# Patient Record
Sex: Female | Born: 1945 | Race: White | Hispanic: No | Smoking: Former smoker
Health system: Southern US, Community
[De-identification: ages and names within clinical notes are randomized; demographics above are authoritative.]

## PROBLEM LIST (undated history)

## (undated) DIAGNOSIS — C801 Malignant (primary) neoplasm, unspecified: Secondary | ICD-10-CM

## (undated) DIAGNOSIS — Z972 Presence of dental prosthetic device (complete) (partial): Secondary | ICD-10-CM

## (undated) DIAGNOSIS — R519 Headache, unspecified: Secondary | ICD-10-CM

## (undated) DIAGNOSIS — J449 Chronic obstructive pulmonary disease, unspecified: Secondary | ICD-10-CM

## (undated) HISTORY — PX: ABDOMINAL HYSTERECTOMY: SHX81

---

## 2014-01-10 DIAGNOSIS — Z923 Personal history of irradiation: Secondary | ICD-10-CM

## 2014-01-10 HISTORY — DX: Personal history of irradiation: Z92.3

## 2014-05-11 DIAGNOSIS — C801 Malignant (primary) neoplasm, unspecified: Secondary | ICD-10-CM

## 2014-05-11 HISTORY — PX: MASTECTOMY, PARTIAL: SHX709

## 2014-05-11 HISTORY — DX: Malignant (primary) neoplasm, unspecified: C80.1

## 2014-05-11 HISTORY — PX: BREAST LUMPECTOMY: SHX2

## 2016-09-29 ENCOUNTER — Observation Stay (HOSPITAL_BASED_OUTPATIENT_CLINIC_OR_DEPARTMENT_OTHER)
Admit: 2016-09-29 | Discharge: 2016-09-29 | Disposition: A | Discharge status: Home / Self Care | Payer: BLUE CROSS/BLUE SHIELD | Attending: Internal Medicine | Admitting: Internal Medicine

## 2016-09-29 ENCOUNTER — Observation Stay: Payer: BLUE CROSS/BLUE SHIELD

## 2016-09-29 ENCOUNTER — Observation Stay
Admission: EM | Admit: 2016-09-29 | Discharge: 2016-09-30 | Disposition: A | Discharge status: Home / Self Care | Payer: BLUE CROSS/BLUE SHIELD | Attending: Internal Medicine | Admitting: Internal Medicine

## 2016-09-29 ENCOUNTER — Encounter: Payer: Self-pay | Admitting: Emergency Medicine

## 2016-09-29 ENCOUNTER — Emergency Department: Payer: BLUE CROSS/BLUE SHIELD

## 2016-09-29 DIAGNOSIS — Z90710 Acquired absence of both cervix and uterus: Secondary | ICD-10-CM | POA: Diagnosis not present

## 2016-09-29 DIAGNOSIS — I951 Orthostatic hypotension: Secondary | ICD-10-CM | POA: Diagnosis not present

## 2016-09-29 DIAGNOSIS — I700 Atherosclerosis of aorta: Secondary | ICD-10-CM | POA: Diagnosis not present

## 2016-09-29 DIAGNOSIS — E041 Nontoxic single thyroid nodule: Secondary | ICD-10-CM | POA: Insufficient documentation

## 2016-09-29 DIAGNOSIS — R55 Syncope and collapse: Secondary | ICD-10-CM | POA: Diagnosis not present

## 2016-09-29 DIAGNOSIS — Z853 Personal history of malignant neoplasm of breast: Secondary | ICD-10-CM | POA: Insufficient documentation

## 2016-09-29 DIAGNOSIS — I6521 Occlusion and stenosis of right carotid artery: Secondary | ICD-10-CM | POA: Insufficient documentation

## 2016-09-29 DIAGNOSIS — F17210 Nicotine dependence, cigarettes, uncomplicated: Secondary | ICD-10-CM | POA: Insufficient documentation

## 2016-09-29 DIAGNOSIS — Z7982 Long term (current) use of aspirin: Secondary | ICD-10-CM | POA: Diagnosis not present

## 2016-09-29 DIAGNOSIS — R0902 Hypoxemia: Secondary | ICD-10-CM | POA: Insufficient documentation

## 2016-09-29 HISTORY — DX: Malignant (primary) neoplasm, unspecified: C80.1

## 2016-09-29 LAB — URINALYSIS, COMPLETE (UACMP) WITH MICROSCOPIC
Bilirubin Urine: NEGATIVE
GLUCOSE, UA: 50 mg/dL — AB
Hgb urine dipstick: NEGATIVE
Ketones, ur: NEGATIVE mg/dL
LEUKOCYTES UA: NEGATIVE
Nitrite: NEGATIVE
PH: 6 (ref 5.0–8.0)
Protein, ur: NEGATIVE mg/dL
RBC / HPF: NONE SEEN RBC/hpf (ref 0–5)
SPECIFIC GRAVITY, URINE: 1.004 — AB (ref 1.005–1.030)

## 2016-09-29 LAB — CBC WITH DIFFERENTIAL/PLATELET
BASOS ABS: 0 10*3/uL (ref 0–0.1)
Basophils Relative: 0 %
Eosinophils Absolute: 0 10*3/uL (ref 0–0.7)
Eosinophils Relative: 0 %
HEMATOCRIT: 40.7 % (ref 35.0–47.0)
Hemoglobin: 14.5 g/dL (ref 12.0–16.0)
LYMPHS ABS: 0.8 10*3/uL — AB (ref 1.0–3.6)
LYMPHS PCT: 15 %
MCH: 31.3 pg (ref 26.0–34.0)
MCHC: 35.5 g/dL (ref 32.0–36.0)
MCV: 88 fL (ref 80.0–100.0)
MONO ABS: 0.1 10*3/uL — AB (ref 0.2–0.9)
MONOS PCT: 2 %
NEUTROS ABS: 4.5 10*3/uL (ref 1.4–6.5)
Neutrophils Relative %: 83 %
Platelets: 287 10*3/uL (ref 150–440)
RBC: 4.63 MIL/uL (ref 3.80–5.20)
RDW: 13.5 % (ref 11.5–14.5)
WBC: 5.5 10*3/uL (ref 3.6–11.0)

## 2016-09-29 LAB — COMPREHENSIVE METABOLIC PANEL
ALT: 17 U/L (ref 14–54)
AST: 23 U/L (ref 15–41)
Albumin: 4.7 g/dL (ref 3.5–5.0)
Alkaline Phosphatase: 54 U/L (ref 38–126)
Anion gap: 7 (ref 5–15)
BILIRUBIN TOTAL: 1.2 mg/dL (ref 0.3–1.2)
BUN: 12 mg/dL (ref 6–20)
CO2: 26 mmol/L (ref 22–32)
Calcium: 9.3 mg/dL (ref 8.9–10.3)
Chloride: 104 mmol/L (ref 101–111)
Creatinine, Ser: 0.94 mg/dL (ref 0.44–1.00)
GFR calc Af Amer: >60 mL/min (ref >60)
GFR, EST NON AFRICAN AMERICAN: 59 mL/min — AB (ref >60)
Glucose, Bld: 190 mg/dL — ABNORMAL HIGH (ref 65–99)
POTASSIUM: 3.9 mmol/L (ref 3.5–5.1)
Sodium: 137 mmol/L (ref 135–145)
TOTAL PROTEIN: 7.5 g/dL (ref 6.5–8.1)

## 2016-09-29 LAB — CK: Total CK: 66 U/L (ref 38–234)

## 2016-09-29 LAB — TROPONIN I
Troponin I: 0.03 ng/mL (ref <0.03)
Troponin I: <0.03 ng/mL (ref <0.03)

## 2016-09-29 LAB — ECHOCARDIOGRAM COMPLETE
Height: 64 in
Weight: 1904 oz

## 2016-09-29 MED ORDER — POLYETHYLENE GLYCOL 3350 17 G PO PACK
17.0000 g | PACK | Freq: Every day | ORAL | Status: DC | PRN
Start: 2016-09-29 — End: 2016-09-30

## 2016-09-29 MED ORDER — SODIUM CHLORIDE 0.9% FLUSH: 3.0000 mL | Freq: Two times a day (BID) | INTRAVENOUS | Status: DC

## 2016-09-29 MED ORDER — IOPAMIDOL (ISOVUE-370) INJECTION 76%
75.0000 mL | Freq: Once | INTRAVENOUS | Status: AC | PRN
  Administered 2016-09-29: 75 mL via INTRAVENOUS

## 2016-09-29 MED ORDER — ENOXAPARIN SODIUM 40 MG/0.4ML ~~LOC~~ SOLN: 40.0000 mg | SUBCUTANEOUS | Status: DC

## 2016-09-29 MED ORDER — ACETAMINOPHEN 650 MG RE SUPP: 650.0000 mg | Freq: Four times a day (QID) | RECTAL | Status: DC | PRN

## 2016-09-29 MED ORDER — ALBUTEROL SULFATE (2.5 MG/3ML) 0.083% IN NEBU: 2.5000 mg | INHALATION_SOLUTION | RESPIRATORY_TRACT | Status: DC | PRN

## 2016-09-29 MED ORDER — ACETAMINOPHEN 325 MG PO TABS
650.0000 mg | ORAL_TABLET | Freq: Four times a day (QID) | ORAL | Status: DC | PRN
  Administered 2016-09-29 – 2016-09-30 (×2): 650 mg via ORAL
  Filled 2016-09-29 (×2): qty 2

## 2016-09-29 MED ORDER — SODIUM CHLORIDE 0.9 % IV SOLN
1000.0000 mL | Freq: Once | INTRAVENOUS | Status: AC
  Administered 2016-09-29: 1000 mL via INTRAVENOUS

## 2016-09-29 MED ORDER — PROMETHAZINE HCL 25 MG PO TABS
12.5000 mg | ORAL_TABLET | Freq: Four times a day (QID) | ORAL | Status: DC | PRN
  Filled 2016-09-29: qty 1

## 2016-09-29 MED ORDER — NICOTINE 14 MG/24HR TD PT24
14.0000 mg | MEDICATED_PATCH | Freq: Every day | TRANSDERMAL | Status: DC
  Administered 2016-09-29: 13:00:00 14 mg via TRANSDERMAL
  Filled 2016-09-29: qty 1

## 2016-09-29 MED ORDER — SODIUM CHLORIDE 0.9 % IV SOLN
INTRAVENOUS | Status: AC
  Administered 2016-09-29 – 2016-09-30 (×2): via INTRAVENOUS

## 2016-09-29 NOTE — H&P (Addendum)
[name]    MR#:  [id number]  DATE OF BIRTH:  [birth year]  DATE OF ADMISSION:  09/29/2016  PRIMARY CARE PHYSICIAN: Kathryn Clause, MD   REQUESTING/REFERRING PHYSICIAN: Dr. Jimmye Norman  CHIEF COMPLAINT:   Loss of consciousness HISTORY OF PRESENT ILLNESS:  Kathryn Hart  is a 71 y.o. female with a known history of left-sided breast cancer status post left lumpectomy, hysterectomy, radiation therapy had one syncopal episode today. Patient went to the bathroom and while sitting on the commode she became diaphoretic and passed out. Family reports they found her slumped over and leaning against the wall complaining of difficulty breathing.Blood compressive the past. CT head is negative. CT angiogram of the chest with no pulmonary embolism status post radiation therapy changes. Initial troponin is negative. Chest x-ray negative. Patient was found to be orthostatic in the emergency department. Hospitalist team is called to admit the patient. As patient has received one fluid bolus in the ED. Denies any dizziness or chest pain and resting comfortably during my examination  PAST MEDICAL HISTORY:   Past Medical History:  Diagnosis Date  . Cancer ([place])    left sided breast cancer    PAST SURGICAL HISTOIRY:  Hysterectomy Partial mastectomy on the left side  SOCIAL HISTORY:   Social History  Substance Use Topics  . Smoking status: Current Every Day Smoker    Packs/day: 0.50    Types: Cigarettes  . Smokeless tobacco: Never Used  . Alcohol use Yes     Comment: once per week    FAMILY HISTORY:  No family history on file.  DRUG ALLERGIES:  No Known Allergies  REVIEW OF SYSTEMS:  CONSTITUTIONAL: No fever, fatigue or weakness.  EYES: No blurred or double vision.  EARS, NOSE, AND THROAT: No tinnitus or ear pain.  RESPIRATORY: No cough, shortness of breath, wheezing or hemoptysis.  CARDIOVASCULAR:  No chest pain, orthopnea, edema.  GASTROINTESTINAL: No nausea, vomiting, diarrhea or abdominal pain.  GENITOURINARY: No dysuria, hematuria.  ENDOCRINE: No polyuria, nocturia,  HEMATOLOGY: No anemia, easy bruising or bleeding SKIN: No rash or lesion. MUSCULOSKELETAL: No joint pain or arthritis.   NEUROLOGIC: No tingling, numbness, weakness.  PSYCHIATRY: No anxiety or depression.   MEDICATIONS AT HOME:   Prior to Admission medications   Not on File      VITAL SIGNS:  Blood pressure 128/83, pulse 90, temperature (!) 97.5 F (36.4 C), temperature source Oral, resp. rate (!) 24, height 5\' 4"  (1.626 m), weight 53.5 kg (118 lb), SpO2 93 %.  PHYSICAL EXAMINATION:  GENERAL:  71 y.o.-year-old patient lying in the bed with no acute distress.  EYES: Pupils equal, round, reactive to light and accommodation. No scleral icterus. Extraocular muscles intact.  HEENT: Head atraumatic, normocephalic. Oropharynx and nasopharynx clear.  NECK:  Supple, no jugular venous distention. No thyroid enlargement, no tenderness.  LUNGS: Normal breath sounds bilaterally, no wheezing, rales,rhonchi or crepitation. No use of accessory muscles of respiration.  CARDIOVASCULAR: S1, S2 normal. No murmurs, rubs, or gallops.  ABDOMEN: Soft, nontender, nondistended. Bowel sounds present. No organomegaly or mass.  EXTREMITIES: No pedal edema, cyanosis, or clubbing.  NEUROLOGIC: Cranial nerves II through XII are intact. Muscle strength 5/5 in all extremities. Sensation intact. Gait not checked.  PSYCHIATRIC: The patient is alert and oriented x 3.  SKIN: No obvious rash, lesion, or ulcer.   LABORATORY PANEL:   CBC  Recent Labs Lab 09/29/16 0758  WBC  5.5  HGB 14.5  HCT 40.7  PLT 287   ------------------------------------------------------------------------------------------------------------------  Chemistries   Recent Labs Lab 09/29/16 0758  NA 137  K 3.9  CL 104  CO2 26  GLUCOSE 190*  BUN 12   CREATININE 0.94  CALCIUM 9.3  AST 23  ALT 17  ALKPHOS 54  BILITOT 1.2   ------------------------------------------------------------------------------------------------------------------  Cardiac Enzymes  Recent Labs Lab 09/29/16 0758  TROPONINI <0.03   ------------------------------------------------------------------------------------------------------------------  RADIOLOGY:  Dg Chest 2 View  Result Date: 09/29/2016 CLINICAL DATA:  Syncopal episode, hypotension and hypoxia this morning. EXAM: CHEST  2 VIEW COMPARISON:  None. FINDINGS: Lungs are adequately inflated without consolidation or effusion. No evidence of pneumothorax. Cardiomediastinal silhouette is within normal. There is calcified plaque over the aortic arch. There are mild degenerative changes of the spine. IMPRESSION: No active cardiopulmonary disease. Aortic Atherosclerosis (ICD10-I70.0). Electronically Signed   By: Marin Olp M.D.   On: 09/29/2016 08:17   Ct Head Wo Contrast  Result Date: 09/29/2016 CLINICAL DATA:  Altered level of consciousness EXAM: CT HEAD WITHOUT CONTRAST TECHNIQUE: Contiguous axial images were obtained from the base of the skull through the vertex without intravenous contrast. COMPARISON:  None. FINDINGS: Brain: No evidence of acute infarction, hemorrhage, hydrocephalus, extra-axial collection or mass lesion/mass effect. Normal brain volume and white matter appearance. Vascular: No hyperdense vessel or unexpected calcification. Skull: Normal. Negative for fracture or focal lesion. Sinuses/Orbits: Negative IMPRESSION: Negative head CT. Electronically Signed   By: Monte Fantasia M.D.   On: 09/29/2016 07:41   Ct Angio Chest Pe W And/or Wo Contrast  Result Date: 09/29/2016 CLINICAL DATA:  Syncope, hypotension, hypoxia EXAM: CT ANGIOGRAPHY CHEST WITH CONTRAST TECHNIQUE: Multidetector CT imaging of the chest was performed using the standard protocol during bolus administration of intravenous  contrast. Multiplanar CT image reconstructions and MIPs were obtained to evaluate the vascular anatomy. CONTRAST:  75 cc Isovue 370 IV COMPARISON:  None. FINDINGS: Cardiovascular: No filling defects in the pulmonary arteries to suggest pulmonary emboli. Insert Heart scattered aortic arch calcifications. Mediastinum/Nodes: No mediastinal, hilar, or axillary adenopathy. Trachea and esophagus are unremarkable. Lungs/Pleura: Subpleural scarring in the lingula which could be related to radiation change if the patient has had prior radiation to the left breast. No confluent opacities or effusions. No suspicious pulmonary nodules. Upper Abdomen: Imaging into the upper abdomen shows no acute findings. Musculoskeletal: Chest wall soft tissues are unremarkable. No acute bony abnormality. Review of the MIP images confirms the above findings. IMPRESSION: No evidence of pulmonary embolus. Scarring noted in the anterior lingula, likely scarring, possibly postradiation changes if the patient has had prior left breast radiation. Recommend clinical correlation. No acute cardiopulmonary disease. Electronically Signed   By: Rolm Baptise M.D.   On: 09/29/2016 09:59    EKG:   Orders placed or performed during the hospital encounter of 09/29/16  . ED EKG  . ED EKG  . EKG 12-Lead  . EKG 12-Lead    IMPRESSION AND PLAN:   Kathryn Hart  is a 70 y.o. female with a known history of left-sided breast cancer status post left lumpectomy, hysterectomy, radiation therapy had one syncopal episode today. Patient went to the bathroom and while sitting on the commode she became diaphoretic and passed out. Family reports they found her slumped over and leaning against the wall complaining of difficulty breathing.Blood compressive the past. CT head is negative. CT angiogram of the chest with no pulmonary embolism status post radiation therapy changes. Initial troponin is negative. Chest  x-ray negative  # Syncope from orthostatic  hypotension  Admit under observation status Hydrate with IV fluids and repeat orthostatics in a.m. Monitor patient on telemetry Cycle cardiac biomarkers Echocardiogram and carotid Dopplers EEG Cardiology consult if needed  #History of breast cancer status post radiation therapy Outpatient follow-up with [facility] as recommended  # Dyspnea - nebs prn    #Tobacco abuse disorder Consultation to quit smoking for 4 minutes. She verbalized understanding. We'll start her on nicotine patch. Patient is agreeable  DVT prophylaxis with Lovenox subcutaneous   All the records are reviewed and case discussed with ED provider. Management plans discussed with the patient, family and they are in agreement.  CODE STATUS: fc, husband is HCPOA   TOTAL TIME TAKING CARE OF THIS PATIENT: 45  minutes.   Note: This dictation was prepared with Dragon dictation along with smaller phrase technology. Any transcriptional errors that result from this process are unintentional.  Nicholes Mango M.D on 09/29/2016 at 10:57 AM  Between 7am to 6pm - Pager - [phone]  After 6pm go to [email or web address] - password [email or web address] [facility]  Office  [phone]  CC: Primary care physician; Kathryn Clause, MD

## 2016-09-29 NOTE — Progress Notes (Signed)
*  PRELIMINARY RESULTS* Echocardiogram 2D Echocardiogram has been performed.  Sherrie Sport 09/29/2016, 2:05 PM

## 2016-09-29 NOTE — ED Triage Notes (Signed)
Patient presents to the ED via EMS for syncopal episode, hypotension and hypoxia.  Patient states she had gotten up this am and walked her dog and then went to the bathroom.  Patient's husband states patient was lying on the side of the door frame and husband states patient was stating, "i can't breathe", husband assisted patient to the floor and patient was very diaphoretic and drooling.  Patient is alert and oriented x 4 at this time, repeatedly complaining of being cold.

## 2016-09-29 NOTE — ED Provider Notes (Signed)
[facility] Emergency Department Provider Note       Time seen: ----------------------------------------- 7:20 AM on 09/29/2016 -----------------------------------------     I have reviewed the triage vital signs and the nursing notes.   HISTORY   Chief Complaint No chief complaint on file.    HPI Kathryn Hart is a 71 y.o. female who presents to the ED after being brought in by EMS being found unresponsive on her bathroom floor. Patient states she was going to the bathroom and then the next thing she remembers was EMS getting her off of the bathroom floor. Patient thinks she may have passed out. This does not happen to her before, she has not had any recent illness. EMS reports she was hypotensive when they arrived. She denies any complaints currently other than being cold. Family reports they found her slumped over leaning against the wall complaining of difficulty breathing. This is never happened before.  No past medical history on file.  There are no active problems to display for this patient.   No past surgical history on file.  Allergies Patient has no allergy information on record.  Social History Social History  Substance Use Topics  . Smoking status: Not on file  . Smokeless tobacco: Not on file  . Alcohol use Not on file   Review of Systems Constitutional: Negative for fever.Positive for chills Cardiovascular: Negative for chest pain. Respiratory:Positive shortness of breath Gastrointestinal: Negative for abdominal pain, vomiting and diarrhea. Genitourinary: Negative for dysuria. Musculoskeletal: Negative for back pain. Skin: Negative for rash. Neurological: Negative for headaches, focal weakness or numbness.  All systems negative/normal/unremarkable except as stated in the HPI  ____________________________________________   PHYSICAL EXAM:  VITAL SIGNS: ED Triage Vitals  Enc Vitals Group     BP      Pulse      Resp       Temp      Temp src      SpO2      Weight      Height      Head Circumference      Peak Flow      Pain Score      Pain Loc      Pain Edu?      Excl. in [place]?    Constitutional: Alert and oriented. Mild distress, patient appears agitated Eyes: Conjunctivae are normal. Normal extraocular movements. ENT   Head: Normocephalic and atraumatic.   Nose: No congestion/rhinnorhea.   Mouth/Throat: Mucous membranes are moist.   Neck: No stridor. Cardiovascular: Normal rate, regular rhythm. No murmurs, rubs, or gallops. Respiratory: Normal respiratory effort without tachypnea nor retractions. Breath sounds are clear and equal bilaterally. No wheezes/rales/rhonchi. Gastrointestinal: Soft and nontender. Normal bowel sounds Musculoskeletal: Nontender with normal range of motion in extremities. No lower extremity tenderness nor edema. Neurologic:  Normal speech and language. No gross focal neurologic deficits are appreciated.  Skin:  Diaphoresis is noted Psychiatric: Mildly agitated  ____________________________________________  EKG: Interpreted by me.sinus rhythm rate 85 bpm, prolonged PR interval, normal QRS, normal QT, nonspecific ST-segment changes  ____________________________________________  ED COURSE:  Pertinent labs & imaging results that were available during my care of the patient were reviewed by me and considered in my medical decision making (see chart for details). Patient presents for likely syncope, we will assess with labs and imaging as indicated.   Procedures ____________________________________________   LABS (pertinent positives/negatives)  Labs Reviewed  CBC WITH DIFFERENTIAL/PLATELET - Abnormal; Notable for the following:  Result Value   Lymphs Abs 0.8 (*)    Monocytes Absolute 0.1 (*)    All other components within normal limits  COMPREHENSIVE METABOLIC PANEL - Abnormal; Notable for the following:    Glucose, Bld 190 (*)    GFR calc non Af  Amer 59 (*)    All other components within normal limits  URINALYSIS, COMPLETE (UACMP) WITH MICROSCOPIC - Abnormal; Notable for the following:    Color, Urine STRAW (*)    APPearance CLEAR (*)    Specific Gravity, Urine 1.004 (*)    Glucose, UA 50 (*)    Bacteria, UA RARE (*)    Squamous Epithelial / LPF 0-5 (*)    All other components within normal limits  TROPONIN I  CK    RADIOLOGY Images were viewed by me  CT head Is negative chest x-ray and CT AngioGram of the chest are negative for acute process ____________________________________________  FINAL ASSESSMENT AND PLAN  Syncope   Plan: Patient's labs and imaging were dictated above. Patient had presented for a syncopal event. Patient states she had gone to walk her dog and then suddenly became diaphoretic and felt very ill. She then went to the bathroom and had a prolonged syncopal event. No specific etiology has been discovered and this does not seem vasovagal. I will recommend observation on telemetry. Family is agreeable to plan.   Earleen Newport, MD   Note: This note was generated in part or whole with voice recognition software. Voice recognition is usually quite accurate but there are transcription errors that can and very often do occur. I apologize for any typographical errors that were not detected and corrected.     Earleen Newport, MD 09/29/16 1004

## 2016-09-30 DIAGNOSIS — I951 Orthostatic hypotension: Secondary | ICD-10-CM | POA: Diagnosis not present

## 2016-09-30 LAB — HEMOGLOBIN A1C
HEMOGLOBIN A1C: 5.1 % (ref 4.8–5.6)
MEAN PLASMA GLUCOSE: 99.67 mg/dL

## 2016-09-30 LAB — TSH: TSH: 1.608 u[IU]/mL (ref 0.350–4.500)

## 2016-09-30 LAB — GLUCOSE, CAPILLARY: Glucose-Capillary: 101 mg/dL — ABNORMAL HIGH (ref 65–99)

## 2016-09-30 LAB — TROPONIN I

## 2016-09-30 MED ORDER — ASPIRIN EC 81 MG PO TBEC: 81.0000 mg | DELAYED_RELEASE_TABLET | Freq: Every day | ORAL | 1 refills | Status: AC

## 2016-09-30 NOTE — Discharge Instructions (Addendum)
[facility] ° °DIET:  °Regular diet ° °DISCHARGE CONDITION:  °Stable ° °ACTIVITY:  °Activity as tolerated ° °OXYGEN:  °Home Oxygen: No. °  °Oxygen Delivery: room air ° °DISCHARGE LOCATION:  °home  ° ° °ADDITIONAL DISCHARGE INSTRUCTION: ° ° °If you experience worsening of your admission symptoms, develop shortness of breath, life threatening emergency, suicidal or homicidal thoughts you must seek medical attention immediately by calling 911 or calling your MD immediately  if symptoms less severe. ° °You Must read complete instructions/literature along with all the possible adverse reactions/side effects for all the Medicines you take and that have been prescribed to you. Take any new Medicines after you have completely understood and accpet all the possible adverse reactions/side effects.  ° °Please note ° °You were cared for by a hospitalist during your hospital stay. If you have any questions about your discharge medications or the care you received while you were in the hospital after you are discharged, you can call the unit and asked to speak with the hospitalist on call if the hospitalist that took care of you is not available. Once you are discharged, your primary care physician will handle any further medical issues. Please note that NO REFILLS for any discharge medications will be authorized once you are discharged, as it is imperative that you return to your primary care physician (or establish a relationship with a primary care physician if you do not have one) for your aftercare needs so that they can reassess your need for medications and monitor your lab values. ° ° °

## 2016-09-30 NOTE — Plan of Care (Signed)
Problem: Education: Goal: Knowledge of Kathryn Hart General Education information/materials will improve Outcome: Progressing VSS, free of falls during shift.  Reported HA 2-3/10, received PRN PO Tylenol 650mg  x2.  No other complaints overnight.  Bed in low position, call bell within reach.  WCTM.

## 2016-09-30 NOTE — Discharge Summary (Signed)
[place] at [facility], [place] y.o., DOB Jun 27, 1945, MRN [id number]. Admission date: 09/29/2016 Discharge Date 09/30/2016 Primary MD Mertie Clause, MD Admitting Physician Nicholes Mango, MD  Admission Diagnosis  Syncope, unspecified syncope type [R55]  Discharge Diagnosis   Active Problems:   Syncope likely vasovagal and due to some dehydration  History of breast  Nicotine abuse      Hospital Course  Kathryn Hart  is a 71 y.o. female with a known history of left-sided breast cancer status post left lumpectomy, hysterectomy, radiation therapy had one syncopal episode yesterday. Patient went to the bathroom and while sitting on the commode she became diaphoretic and passed out.  Patient did strain with the bowel movements. She was brought to the emergency room and was noted to be orthostatic hypotension. Patient was given IV fluids. She underwent carotid Doppler which did show minor plaque. Also had a echocardiogram showed normal ejection fraction and no significant valvular dysfunction. Patient had telemetry which showed no arrhythmia. Currently she is asymptomatic and wanting to go home.   NIcotine cessation provided, 4 min spent recommend she stop smoking, recommend nicotine patch , patient not intrested in replacment.            Consults  None  Significant Tests:  See full reports for all details    Dg Chest 2 View  Result Date: 09/29/2016 CLINICAL DATA:  Syncopal episode, hypotension and hypoxia this morning. EXAM: CHEST  2 VIEW COMPARISON:  None. FINDINGS: Lungs are adequately inflated without consolidation or effusion. No evidence of pneumothorax. Cardiomediastinal silhouette is within normal. There is calcified plaque over the aortic arch. There are mild degenerative changes of the spine. IMPRESSION: No active cardiopulmonary disease. Aortic Atherosclerosis (ICD10-I70.0). Electronically Signed   By: Marin Olp M.D.   On:  09/29/2016 08:17   Ct Head Wo Contrast  Result Date: 09/29/2016 CLINICAL DATA:  Altered level of consciousness EXAM: CT HEAD WITHOUT CONTRAST TECHNIQUE: Contiguous axial images were obtained from the base of the skull through the vertex without intravenous contrast. COMPARISON:  None. FINDINGS: Brain: No evidence of acute infarction, hemorrhage, hydrocephalus, extra-axial collection or mass lesion/mass effect. Normal brain volume and white matter appearance. Vascular: No hyperdense vessel or unexpected calcification. Skull: Normal. Negative for fracture or focal lesion. Sinuses/Orbits: Negative IMPRESSION: Negative head CT. Electronically Signed   By: Monte Fantasia M.D.   On: 09/29/2016 07:41   Ct Angio Chest Pe W And/or Wo Contrast  Result Date: 09/29/2016 CLINICAL DATA:  Syncope, hypotension, hypoxia EXAM: CT ANGIOGRAPHY CHEST WITH CONTRAST TECHNIQUE: Multidetector CT imaging of the chest was performed using the standard protocol during bolus administration of intravenous contrast. Multiplanar CT image reconstructions and MIPs were obtained to evaluate the vascular anatomy. CONTRAST:  75 cc Isovue 370 IV COMPARISON:  None. FINDINGS: Cardiovascular: No filling defects in the pulmonary arteries to suggest pulmonary emboli. Insert Heart scattered aortic arch calcifications. Mediastinum/Nodes: No mediastinal, hilar, or axillary adenopathy. Trachea and esophagus are unremarkable. Lungs/Pleura: Subpleural scarring in the lingula which could be related to radiation change if the patient has had prior radiation to the left breast. No confluent opacities or effusions. No suspicious pulmonary nodules. Upper Abdomen: Imaging into the upper abdomen shows no acute findings. Musculoskeletal: Chest wall soft tissues are unremarkable. No acute bony abnormality. Review of the MIP images confirms the above findings. IMPRESSION: No evidence of pulmonary embolus. Scarring noted in the anterior lingula, likely scarring,  possibly postradiation changes if the patient  has had prior left breast radiation. Recommend clinical correlation. No acute cardiopulmonary disease. Electronically Signed   By: Rolm Baptise M.D.   On: 09/29/2016 09:59   US Carotid Bilateral  Result Date: 09/29/2016 CLINICAL DATA:  Syncopal episode.  History of smoking. EXAM: BILATERAL CAROTID DUPLEX ULTRASOUND TECHNIQUE: Pearline Cables scale imaging, color Doppler and duplex ultrasound were performed of bilateral carotid and vertebral arteries in the neck. COMPARISON:  None. FINDINGS: Criteria: Quantification of carotid stenosis is based on velocity parameters that correlate the residual internal carotid diameter with NASCET-based stenosis levels, using the diameter of the distal internal carotid lumen as the denominator for stenosis measurement. The following velocity measurements were obtained: RIGHT ICA:  95/25 cm/sec CCA:  88/41 cm/sec SYSTOLIC ICA/CCA RATIO:  1.1 DIASTOLIC ICA/CCA RATIO:  1.3 ECA:  95 cm/sec LEFT ICA:  76/28 cm/sec CCA:  66/06 cm/sec SYSTOLIC ICA/CCA RATIO:  0.9 DIASTOLIC ICA/CCA RATIO:  0.7 ECA:  89 cm/sec RIGHT CAROTID ARTERY: Note is made of a minimal amount of atherosclerotic plaque within the right carotid bulb (image 16), extending to involve the origin and proximal aspects of the right internal carotid artery (24), not resulting in elevated peak systolic velocities within the interrogated course the right internal carotid artery to suggest a hemodynamically significant stenosis. RIGHT VERTEBRAL ARTERY:  Antegrade flow LEFT CAROTID ARTERY: There is no grayscale evidence of significant intimal thickening or atherosclerotic plaque affecting interrogated portions of the left carotid system. There are no elevated peak systolic velocities within the interrogated course the left internal carotid artery to suggest a hemodynamically significant stenosis. LEFT VERTEBRAL ARTERY:  Antegrade flow Note is made of an approximately 3.6 x 2.6 x 2.7 cm complex  partially cystic, partially solid nodule within the left lobe of the thyroid which is noted to contain multiple internal echogenic foci. IMPRESSION: 1. Minimal amount of right-sided atherosclerotic plaque, not resulting in a hemodynamically significant stenosis. 2. Unremarkable sonographic evaluation of the left carotid system. 3. Incidentally noted approximately 3.6 cm left-sided thyroid nodule with worrisome features - further evaluation dedicated thyroid ultrasound an could be performed as clinically indicated. Electronically Signed   By: Sandi Mariscal M.D.   On: 09/29/2016 17:24       Today   Subjective:   Kathryn Hart  Patient feels well denies any symptoms very anxious to go home  Objective:   Blood pressure (!) 117/54, pulse 74, temperature 97.6 F (36.4 C), resp. rate 20, height 5\' 4"  (1.626 m), weight 122 lb 1.6 oz (55.4 kg), SpO2 97 %.  .  Intake/Output Summary (Last 24 hours) at 09/30/16 1328 Last data filed at 09/30/16 0930  Gross per 24 hour  Intake             2424 ml  Output                0 ml  Net             2424 ml    Exam VITAL SIGNS: Blood pressure (!) 117/54, pulse 74, temperature 97.6 F (36.4 C), resp. rate 20, height 5\' 4"  (1.626 m), weight 122 lb 1.6 oz (55.4 kg), SpO2 97 %.  GENERAL:  71 y.o.-year-old patient lying in the bed with no acute distress.  EYES: Pupils equal, round, reactive to light and accommodation. No scleral icterus. Extraocular muscles intact.  HEENT: Head atraumatic, normocephalic. Oropharynx and nasopharynx clear.  NECK:  Supple, no jugular venous distention. No thyroid enlargement, no tenderness.  LUNGS: Normal breath sounds bilaterally, no  wheezing, rales,rhonchi or crepitation. No use of accessory muscles of respiration.  CARDIOVASCULAR: S1, S2 normal. No murmurs, rubs, or gallops.  ABDOMEN: Soft, nontender, nondistended. Bowel sounds present. No organomegaly or mass.  EXTREMITIES: No pedal edema, cyanosis, or clubbing.   NEUROLOGIC: Cranial nerves II through XII are intact. Muscle strength 5/5 in all extremities. Sensation intact. Gait not checked.  PSYCHIATRIC: The patient is alert and oriented x 3.  SKIN: No obvious rash, lesion, or ulcer.   Data Review     CBC w Diff: Lab Results  Component Value Date   WBC 5.5 09/29/2016   HGB 14.5 09/29/2016   HCT 40.7 09/29/2016   PLT 287 09/29/2016   LYMPHOPCT 15 09/29/2016   MONOPCT 2 09/29/2016   EOSPCT 0 09/29/2016   BASOPCT 0 09/29/2016   CMP: Lab Results  Component Value Date   NA 137 09/29/2016   K 3.9 09/29/2016   CL 104 09/29/2016   CO2 26 09/29/2016   BUN 12 09/29/2016   CREATININE 0.94 09/29/2016   PROT 7.5 09/29/2016   ALBUMIN 4.7 09/29/2016   BILITOT 1.2 09/29/2016   ALKPHOS 54 09/29/2016   AST 23 09/29/2016   ALT 17 09/29/2016  .  Micro Results No results found for this or any previous visit (from the past 240 hour(s)).      Code Status Orders        Start     Ordered   09/29/16 1152  Full code  Continuous     09/29/16 1151    Code Status History    Date Active Date Inactive Code Status Order ID Comments User Context   This patient has a current code status but no historical code status.          Follow-up Information    Mertie Clause, MD Follow up on 10/04/2016.   Specialty:  Family Medicine Why:  @8 :Judeen Hammans information: [address] [place] [id number] [phone]           Discharge Medications   Allergies as of 09/30/2016   No Known Allergies     Medication List    TAKE these medications   aspirin EC 81 MG tablet Take 1 tablet (81 mg total) by mouth daily.            Discharge Care Instructions        Start     Ordered   09/30/16 0000  aspirin EC 81 MG tablet  Daily     09/30/16 1116         Total Time in preparing paper work, data evaluation and todays exam - 35 minutes  Dustin Flock M.D on 09/30/2016 at [facility] PM  [facility]   Office  [phone]

## 2016-09-30 NOTE — Progress Notes (Signed)
Pt  For discharge home. Alert. No resp distress/ pt exhibits no s/s of dizziness or syncope.  Denies pain. Instructions discussed with pt. Med/ diet / activity and f/u discussed. Verbalizes understanding.  Iv and site d/cd w/o problems. Home  At this time via w/c w/o c/o.

## 2016-09-30 NOTE — Discharge Summary (Deleted)
NIcotine cessation provided, 4 min spent recommend she stop smoking, recommend nicotine patch , patient not intrested in replacment.

## 2016-09-30 NOTE — Care Management Obs Status (Signed)
[facility] NOTIFICATION   Patient Details  Name: Ambriella Kitt MRN: [id number] Date of Birth: [birth year]   Medicare Observation Status Notification Given:  Yes Notice signed, one given to patient and the other to HIM for scanning    Katrina Stack, RN 09/30/2016, 11:13 AM

## 2016-10-18 ENCOUNTER — Other Ambulatory Visit: Payer: Self-pay | Admitting: Family Medicine

## 2016-10-18 DIAGNOSIS — E041 Nontoxic single thyroid nodule: Secondary | ICD-10-CM

## 2017-03-08 ENCOUNTER — Other Ambulatory Visit: Payer: Self-pay | Admitting: Medical Oncology

## 2017-03-08 DIAGNOSIS — L989 Disorder of the skin and subcutaneous tissue, unspecified: Secondary | ICD-10-CM

## 2017-03-08 DIAGNOSIS — E041 Nontoxic single thyroid nodule: Secondary | ICD-10-CM

## 2017-03-14 ENCOUNTER — Ambulatory Visit
Admission: RE | Admit: 2017-03-14 | Discharge: 2017-03-14 | Disposition: A | Discharge status: Home / Self Care | Payer: BLUE CROSS/BLUE SHIELD | Source: Ambulatory Visit | Attending: Medical Oncology | Admitting: Medical Oncology

## 2017-03-14 ENCOUNTER — Other Ambulatory Visit: Payer: Self-pay | Admitting: Medical Oncology

## 2017-03-14 DIAGNOSIS — L989 Disorder of the skin and subcutaneous tissue, unspecified: Secondary | ICD-10-CM | POA: Diagnosis present

## 2017-03-14 DIAGNOSIS — M8589 Other specified disorders of bone density and structure, multiple sites: Secondary | ICD-10-CM

## 2017-03-14 DIAGNOSIS — E041 Nontoxic single thyroid nodule: Secondary | ICD-10-CM | POA: Insufficient documentation

## 2018-10-07 IMAGING — CT CT ANGIO CHEST
1 of 6 series · 19 of 36 positions shown · IV contrast (isovue)
Comparison: None.

CLINICAL DATA: Syncope, hypotension, hypoxia

EXAM:
CT ANGIOGRAPHY CHEST WITH CONTRAST
TECHNIQUE: Multidetector CT imaging of the chest was performed using the
standard protocol during bolus administration of intravenous
contrast. Multiplanar CT image reconstructions and MIPs were
obtained to evaluate the vascular anatomy.
CONTRAST:  75 cc Isovue 370 IV

[Series 7: thins · axial · 0.65mm/px · z∈[+775,+1062]mm · 19 of 319 slices shown]
[im 16/319  lung]
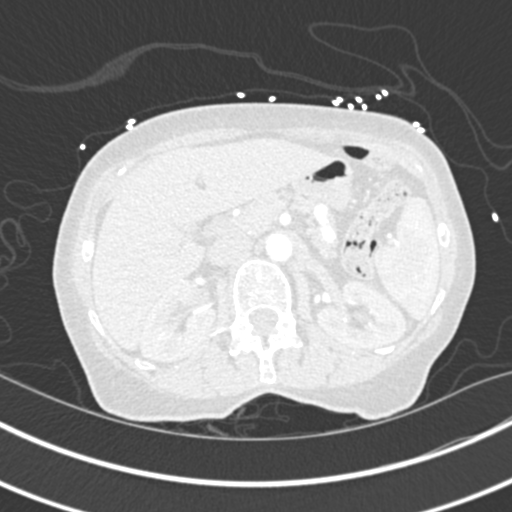
[im 32/319  mediastinal]
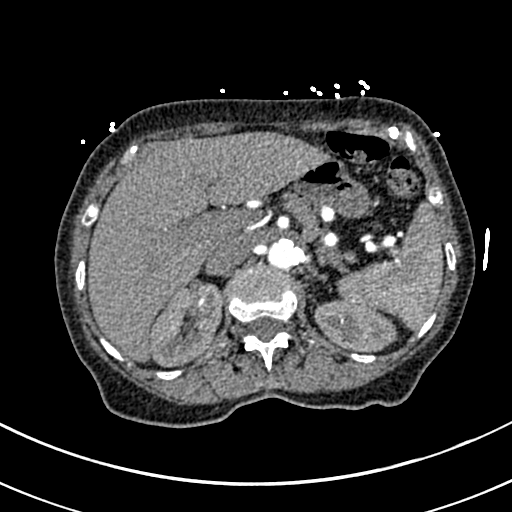
[im 48/319  lung]
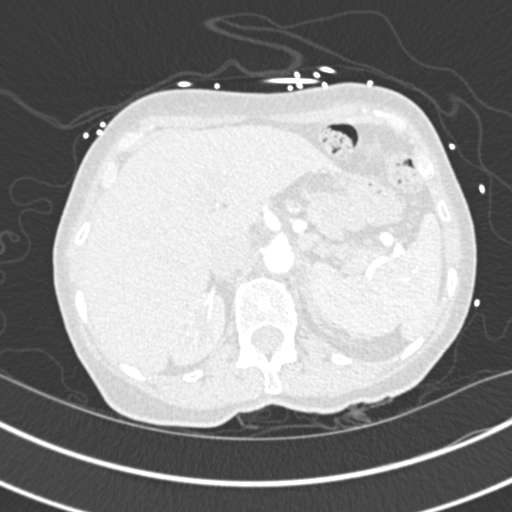
[im 64/319  mediastinal]
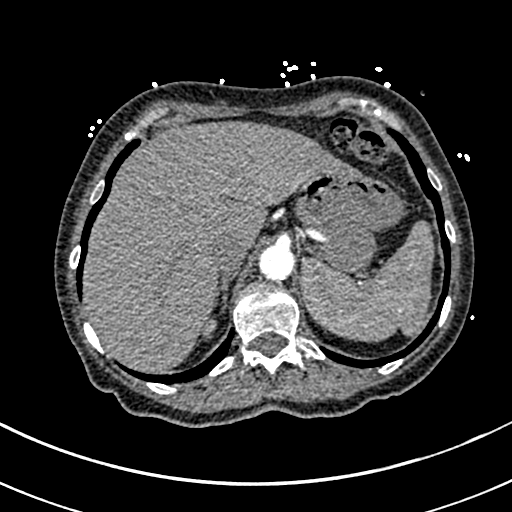
[im 80/319  lung]
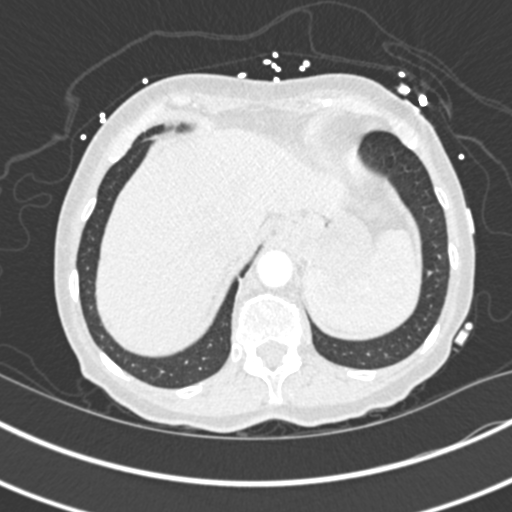
[im 96/319  mediastinal]
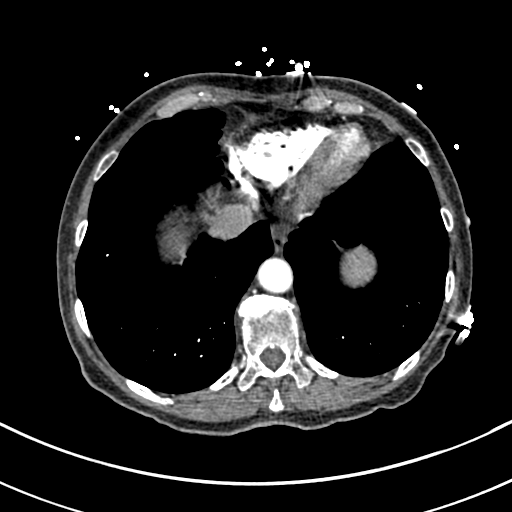
[im 112/319  lung]
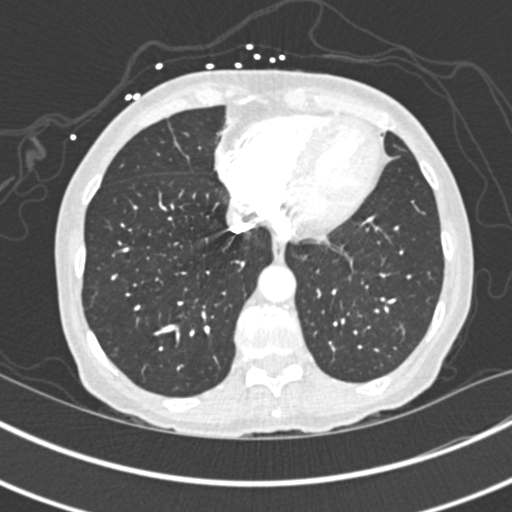
[im 128/319  mediastinal]
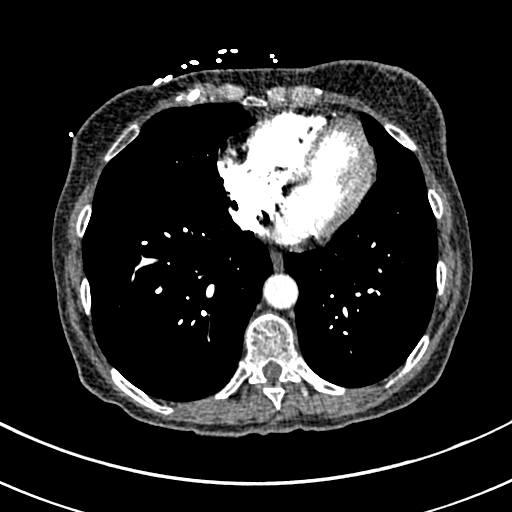
[im 144/319  lung]
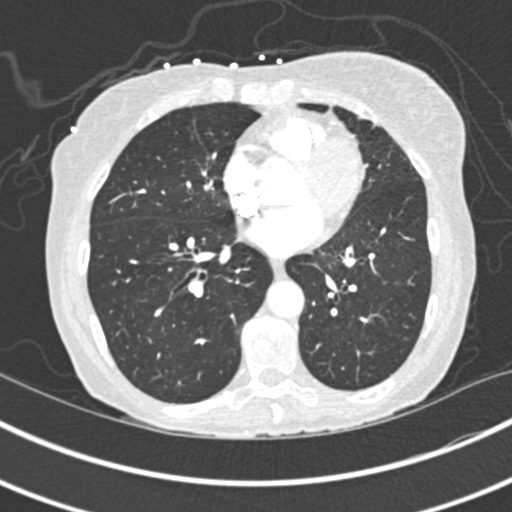
[im 160/319  mediastinal]
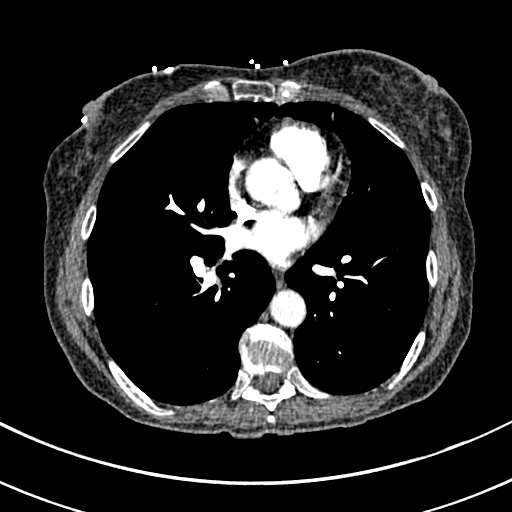
[im 175/319  lung]
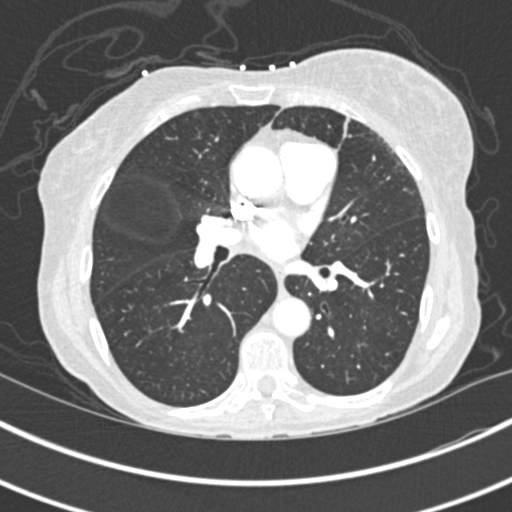
[im 191/319  mediastinal]
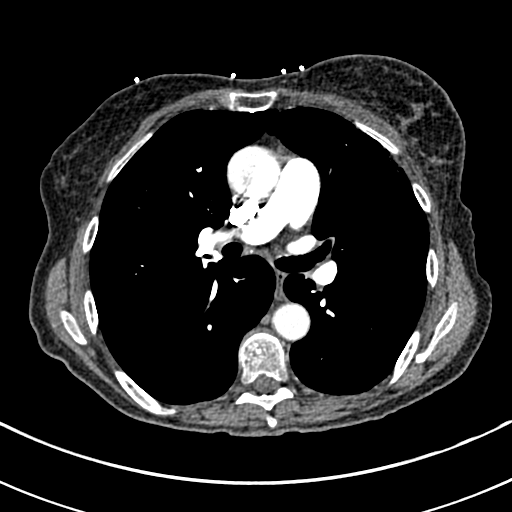
[im 207/319  lung]
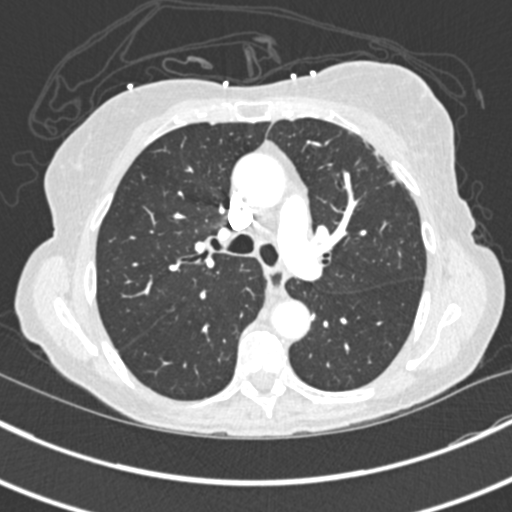
[im 223/319  mediastinal]
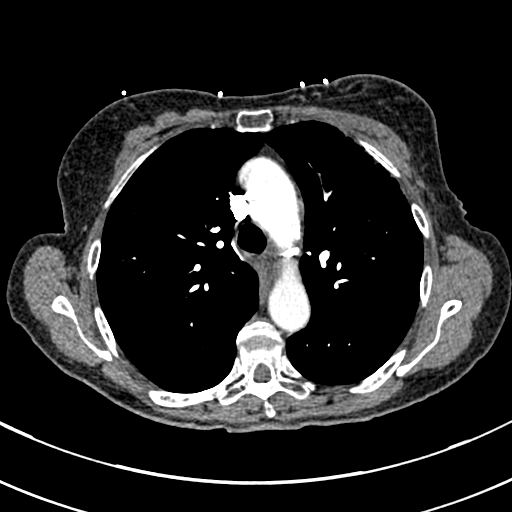
[im 239/319  lung]
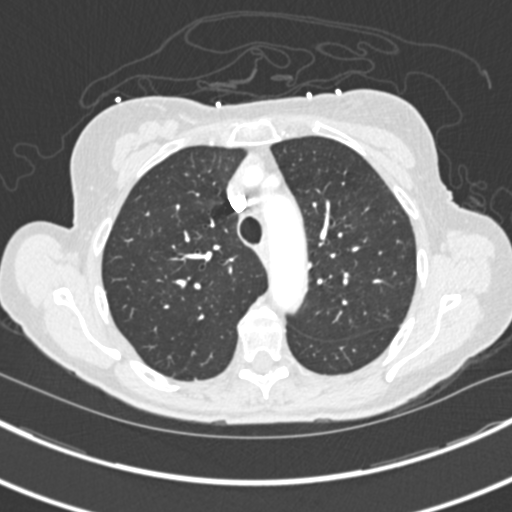
[im 255/319  mediastinal]
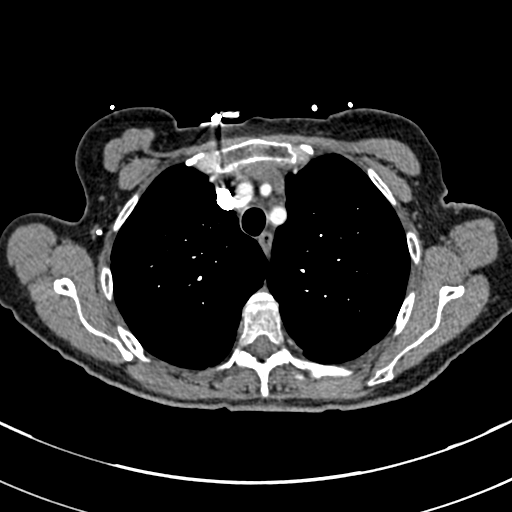
[im 271/319  lung]
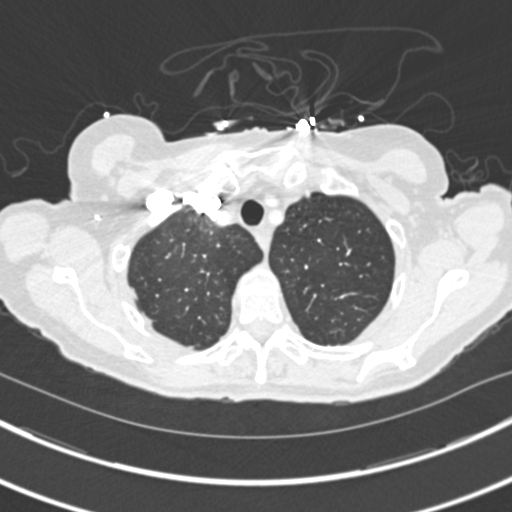
[im 287/319  mediastinal]
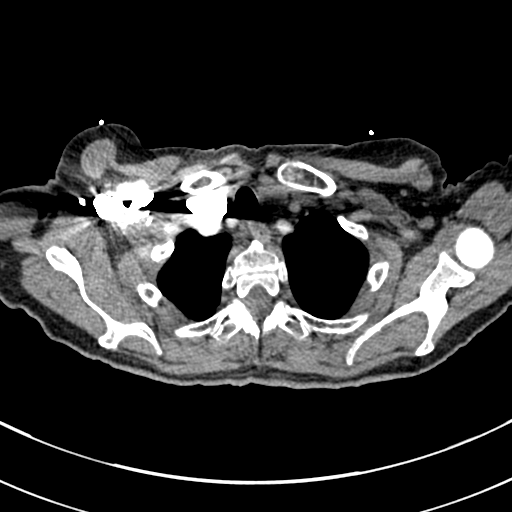
[im 303/319  lung]
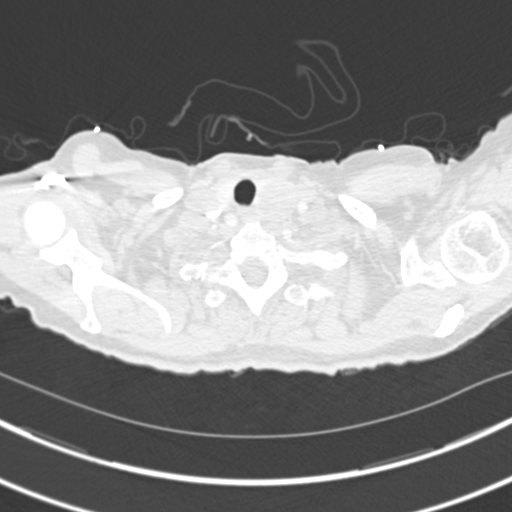

[19 of 36 positions shown; findings below may reference images not displayed]

FINDINGS: Cardiovascular: No filling defects in the pulmonary arteries to
suggest pulmonary emboli. Insert Heart scattered aortic arch
calcifications.

Mediastinum/Nodes: No mediastinal, hilar, or axillary adenopathy.
Trachea and esophagus are unremarkable.

Lungs/Pleura: Subpleural scarring in the lingula which could be
related to radiation change if the patient has had prior radiation
to the left breast. No confluent opacities or effusions. No
suspicious pulmonary nodules.

Upper Abdomen: Imaging into the upper abdomen shows no acute
findings.

Musculoskeletal: Chest wall soft tissues are unremarkable. No acute
bony abnormality.

Review of the MIP images confirms the above findings.
IMPRESSION: No evidence of pulmonary embolus.

Scarring noted in the anterior lingula, likely scarring, possibly
postradiation changes if the patient has had prior left breast
radiation. Recommend clinical correlation.

No acute cardiopulmonary disease.

## 2019-07-10 ENCOUNTER — Encounter: Payer: Self-pay | Admitting: Ophthalmology

## 2019-07-10 ENCOUNTER — Other Ambulatory Visit: Payer: Self-pay

## 2019-07-11 NOTE — Discharge Instructions (Signed)

## 2019-07-16 NOTE — Anesthesia Preprocedure Evaluation (Addendum)
Anesthesia Evaluation  Patient identified by MRN, date of birth, ID band Patient awake    Reviewed: Allergy & Precautions, NPO status , Patient's Chart, lab work & pertinent test results, reviewed documented beta blocker date and time   History of Anesthesia Complications Negative for: history of anesthetic complications  Airway Mallampati: II  TM Distance: >3 FB Neck ROM: Full    Dental   Pulmonary former smoker,    breath sounds clear to auscultation       Cardiovascular (-) angina(-) DOE  Rhythm:Regular Rate:Normal     Neuro/Psych PSYCHIATRIC DISORDERS (PTSD)    GI/Hepatic neg GERD  ,  Endo/Other    Renal/GU      Musculoskeletal   Abdominal   Peds  Hematology   Anesthesia Other Findings L breast cancer  Reproductive/Obstetrics                            Anesthesia Physical Anesthesia Plan  ASA: II  Anesthesia Plan: MAC   Post-op Pain Management:    Induction: Intravenous  PONV Risk Score and Plan: 2 and TIVA, Midazolam and Treatment may vary due to age or medical condition  Airway Management Planned: Nasal Cannula  Additional Equipment:   Intra-op Plan:   Post-operative Plan:   Informed Consent: I have reviewed the patients History and Physical, chart, labs and discussed the procedure including the risks, benefits and alternatives for the proposed anesthesia with the patient or authorized representative who has indicated his/her understanding and acceptance.       Plan Discussed with: CRNA and Anesthesiologist  Anesthesia Plan Comments:         Anesthesia Quick Evaluation

## 2019-07-17 ENCOUNTER — Other Ambulatory Visit: Payer: Self-pay

## 2019-07-17 ENCOUNTER — Encounter: Payer: Self-pay | Admitting: Ophthalmology

## 2019-07-17 ENCOUNTER — Encounter
Admission: RE | Disposition: A | Discharge status: Home / Self Care | Payer: Self-pay | Source: Home / Self Care | Attending: Ophthalmology

## 2019-07-17 ENCOUNTER — Ambulatory Visit
Admission: RE | Admit: 2019-07-17 | Discharge: 2019-07-17 | Disposition: A | Discharge status: Home / Self Care | Payer: Medicare Other | Attending: Ophthalmology | Admitting: Ophthalmology

## 2019-07-17 ENCOUNTER — Ambulatory Visit: Payer: Medicare Other | Admitting: Anesthesiology

## 2019-07-17 DIAGNOSIS — Z853 Personal history of malignant neoplasm of breast: Secondary | ICD-10-CM | POA: Diagnosis not present

## 2019-07-17 DIAGNOSIS — Z79899 Other long term (current) drug therapy: Secondary | ICD-10-CM | POA: Insufficient documentation

## 2019-07-17 DIAGNOSIS — H2511 Age-related nuclear cataract, right eye: Secondary | ICD-10-CM | POA: Insufficient documentation

## 2019-07-17 DIAGNOSIS — Z87891 Personal history of nicotine dependence: Secondary | ICD-10-CM | POA: Insufficient documentation

## 2019-07-17 DIAGNOSIS — J449 Chronic obstructive pulmonary disease, unspecified: Secondary | ICD-10-CM | POA: Diagnosis not present

## 2019-07-17 DIAGNOSIS — F4310 Post-traumatic stress disorder, unspecified: Secondary | ICD-10-CM | POA: Diagnosis not present

## 2019-07-17 HISTORY — PX: CATARACT EXTRACTION W/PHACO: SHX586

## 2019-07-17 SURGERY — PHACOEMULSIFICATION, CATARACT, WITH IOL INSERTION
Anesthesia: Monitor Anesthesia Care | Site: Eye | Laterality: Right

## 2019-07-17 MED ORDER — EPINEPHRINE PF 1 MG/ML IJ SOLN
INTRAOCULAR | Status: DC | PRN
  Administered 2019-07-17: 84 mL via OPHTHALMIC

## 2019-07-17 MED ORDER — LACTATED RINGERS IV SOLN: INTRAVENOUS | Status: DC

## 2019-07-17 MED ORDER — ACETAMINOPHEN 10 MG/ML IV SOLN: 1000.0000 mg | Freq: Once | INTRAVENOUS | Status: DC | PRN

## 2019-07-17 MED ORDER — FENTANYL CITRATE (PF) 100 MCG/2ML IJ SOLN
INTRAMUSCULAR | Status: DC | PRN
  Administered 2019-07-17: 25 ug via INTRAVENOUS
  Administered 2019-07-17: 50 ug via INTRAVENOUS

## 2019-07-17 MED ORDER — MIDAZOLAM HCL 2 MG/2ML IJ SOLN
INTRAMUSCULAR | Status: DC | PRN
  Administered 2019-07-17: .5 mg via INTRAVENOUS
  Administered 2019-07-17: 1 mg via INTRAVENOUS

## 2019-07-17 MED ORDER — ARMC OPHTHALMIC DILATING DROPS
1.0000 "application " | OPHTHALMIC | Status: DC | PRN
  Administered 2019-07-17 (×3): 1 via OPHTHALMIC

## 2019-07-17 MED ORDER — MOXIFLOXACIN HCL 0.5 % OP SOLN
1.0000 [drp] | OPHTHALMIC | Status: DC | PRN
  Administered 2019-07-17 (×3): 1 [drp] via OPHTHALMIC

## 2019-07-17 MED ORDER — TETRACAINE HCL 0.5 % OP SOLN
1.0000 [drp] | OPHTHALMIC | Status: DC | PRN
  Administered 2019-07-17 (×3): 1 [drp] via OPHTHALMIC

## 2019-07-17 MED ORDER — CEFUROXIME OPHTHALMIC INJECTION 1 MG/0.1 ML
INJECTION | OPHTHALMIC | Status: DC | PRN
  Administered 2019-07-17: 0.1 mL via INTRACAMERAL

## 2019-07-17 MED ORDER — ONDANSETRON HCL 4 MG/2ML IJ SOLN: 4.0000 mg | Freq: Once | INTRAMUSCULAR | Status: DC | PRN

## 2019-07-17 MED ORDER — NA HYALUR & NA CHOND-NA HYALUR 0.4-0.35 ML IO KIT
PACK | INTRAOCULAR | Status: DC | PRN
  Administered 2019-07-17: 1 mL via INTRAOCULAR

## 2019-07-17 MED ORDER — BRIMONIDINE TARTRATE-TIMOLOL 0.2-0.5 % OP SOLN
OPHTHALMIC | Status: DC | PRN
  Administered 2019-07-17: 1 [drp] via OPHTHALMIC

## 2019-07-17 MED ORDER — LIDOCAINE HCL (PF) 2 % IJ SOLN
INTRAOCULAR | Status: DC | PRN
  Administered 2019-07-17: 1 mL

## 2019-07-17 SURGICAL SUPPLY — 31 items
ACRYSOF TORIC TRIFOCAL 17.5 ×1 IMPLANT
CANNULA ANT/CHMB 27G (MISCELLANEOUS) ×1 IMPLANT
CANNULA ANT/CHMB 27GA (MISCELLANEOUS) ×3 IMPLANT
GLOVE SURG LX 7.5 STRW (GLOVE) ×4
GLOVE SURG LX STRL 7.5 STRW (GLOVE) ×1 IMPLANT
GLOVE SURG TRIUMPH 8.0 PF LTX (GLOVE) ×3 IMPLANT
GOWN STRL REUS W/ TWL LRG LVL3 (GOWN DISPOSABLE) ×2 IMPLANT
GOWN STRL REUS W/TWL LRG LVL3 (GOWN DISPOSABLE) ×4
LENS ACRYSOF TORIC TRIFOC 17.5 ×1 IMPLANT
LENS IOL 0 D +17.5 DIOP +1.5 ×1 IMPLANT
LENS IOL IQ PAN TRC 30 17.5 IMPLANT
MARKER SKIN DUAL TIP RULER LAB (MISCELLANEOUS) ×3 IMPLANT
NDL CAPSULORHEX 25GA (NEEDLE) ×1 IMPLANT
NDL FILTER BLUNT 18X1 1/2 (NEEDLE) ×2 IMPLANT
NDL RETROBULBAR .5 NSTRL (NEEDLE) IMPLANT
NEEDLE CAPSULORHEX 25GA (NEEDLE) ×3 IMPLANT
NEEDLE FILTER BLUNT 18X 1/2SAF (NEEDLE) ×4
NEEDLE FILTER BLUNT 18X1 1/2 (NEEDLE) ×2 IMPLANT
PACK CATARACT BRASINGTON (MISCELLANEOUS) ×3 IMPLANT
PACK EYE AFTER SURG (MISCELLANEOUS) ×3 IMPLANT
PACK OPTHALMIC (MISCELLANEOUS) ×3 IMPLANT
RING MALYGIN 7.0 (MISCELLANEOUS) IMPLANT
SOLUTION OPHTHALMIC SALT (MISCELLANEOUS) ×3 IMPLANT
SUT ETHILON 10-0 CS-B-6CS-B-6 (SUTURE)
SUT VICRYL  9 0 (SUTURE)
SUT VICRYL 9 0 (SUTURE) IMPLANT
SUTURE EHLN 10-0 CS-B-6CS-B-6 (SUTURE) IMPLANT
SYR 3ML LL SCALE MARK (SYRINGE) ×6 IMPLANT
SYR TB 1ML LUER SLIP (SYRINGE) ×3 IMPLANT
WATER STERILE IRR 250ML POUR (IV SOLUTION) ×3 IMPLANT
WIPE NON LINTING 3.25X3.25 (MISCELLANEOUS) ×3 IMPLANT

## 2019-07-17 NOTE — Transfer of Care (Signed)
Immediate Anesthesia Transfer of Care Note  Patient: Kathryn Hart  Procedure(s) Performed: CATARACT EXTRACTION PHACO AND INTRAOCULAR LENS PLACEMENT ([place]) RIGHT PANOPTIX  TORIC LENS (Right Eye)  Patient Location: PACU  Anesthesia Type: MAC  Level of Consciousness: awake, alert  and patient cooperative  Airway and Oxygen Therapy: Patient Spontanous Breathing   Post-op Assessment: Post-op Vital signs reviewed, Patient's Cardiovascular Status Stable, Respiratory Function Stable, Patent Airway and No signs of Nausea or vomiting  Post-op Vital Signs: Reviewed and stable  Complications: No complications documented.

## 2019-07-17 NOTE — Op Note (Signed)
LOCATION:  [place]   PREOPERATIVE DIAGNOSIS:  Nuclear sclerotic cataract of the right eye.  H25.11   POSTOPERATIVE DIAGNOSIS:  Nuclear sclerotic cataract of the right eye.   PROCEDURE:  Phacoemulsification with Toric posterior chamber intraocular lens placement of the right eye.  Ultrasound time: Procedure(s) with comments: CATARACT EXTRACTION PHACO AND INTRAOCULAR LENS PLACEMENT ([facility]) RIGHT PANOPTIX  TORIC LENS (Right) - 10.72 1:20.2 13.3%  LENS:   Implant Name Type Inv. Item Serial No. Manufacturer Lot No. LRB No. Used Action  acrysof iq panoptix toric iol Intraocular Lens  [id number] ALCON  Right 1 Implanted     [id number] 17.5 D PanOptix Toric intraocular lens with 1.5 diopters of cylindrical power with axis orientation at 33 degrees.    SURGEON:  Wyonia Hough, MD   ANESTHESIA: Topical with tetracaine drops and 2% Xylocaine jelly, augmented with 1% preservative-free intracameral lidocaine. .   COMPLICATIONS:  None.   DESCRIPTION OF PROCEDURE:  The patient was identified in the holding room and transported to the operating suite and placed in the supine position under the operating microscope.  The right eye was identified as the operative eye, and it was prepped and draped in the usual sterile ophthalmic fashion.    A clear-corneal paracentesis incision was made at the 12:00 position.  0.5 ml of preservative-free 1% lidocaine was injected into the anterior chamber. The anterior chamber was filled with Viscoat.  A 2.4 millimeter near clear corneal incision was then made at the 9:00 position.  A cystotome and capsulorrhexis forceps were then used to make a curvilinear capsulorrhexis.  Hydrodissection and hydrodelineation were then performed using balanced salt solution.   Phacoemulsification was then used in stop and chop fashion to remove the lens, nucleus and epinucleus.  The remaining cortex was aspirated using the irrigation and aspiration handpiece.  Provisc  viscoelastic was then placed into the capsular bag to distend it for lens placement.  The Verion digital marker was used to align the implant at the intended axis.   A Toric lens was then injected into the capsular bag.  It was rotated clockwise until the axis marks on the lens were approximately 15 degrees in the counterclockwise direction to the intended alignment.  The viscoelastic was aspirated from the eye using the irrigation aspiration handpiece.  Then, a Koch spatula through the sideport incision was used to rotate the lens in a clockwise direction until the axis markings of the intraocular lens were lined up with the Verion alignment.  Balanced salt solution was then used to hydrate the wounds. Cefuroxime 0.1 ml of a 10mg /ml solution was injected into the anterior chamber for a dose of 1 mg of intracameral antibiotic at the completion of the case.  The eye was noted to have a physiologic pressure and there was no wound leak noted.   Timolol and Brimonidine drops were applied to the eye.  The patient was taken to the recovery room in stable condition having had no complications of anesthesia or surgery.  Sean Malinowski 07/17/2019, 10:09 AM

## 2019-07-17 NOTE — Anesthesia Postprocedure Evaluation (Signed)
Anesthesia Post Note  Patient: Solon [place]  Procedure(s) Performed: CATARACT EXTRACTION PHACO AND INTRAOCULAR LENS PLACEMENT ([facility]) RIGHT PANOPTIX  TORIC LENS (Right Eye)     Patient location during evaluation: PACU Anesthesia Type: MAC Level of consciousness: awake and alert Pain management: pain level controlled Vital Signs Assessment: post-procedure vital signs reviewed and stable Respiratory status: spontaneous breathing, nonlabored ventilation, respiratory function stable and patient connected to nasal cannula oxygen Cardiovascular status: stable and blood pressure returned to baseline Postop Assessment: no apparent nausea or vomiting Anesthetic complications: no   No complications documented.  Ahsha Hinsley A  Esperanza Madrazo

## 2019-07-17 NOTE — H&P (Signed)

## 2019-07-17 NOTE — Anesthesia Procedure Notes (Signed)
Procedure Name: MAC Date/Time: 07/17/2019 9:44 AM Performed by: Vanetta Shawl, CRNA Pre-anesthesia Checklist: Patient identified, Emergency Drugs available, Suction available, Timeout performed and Patient being monitored Patient Re-evaluated:Patient Re-evaluated prior to induction Oxygen Delivery Method: Nasal cannula Placement Confirmation: positive ETCO2

## 2019-07-18 ENCOUNTER — Encounter: Payer: Self-pay | Admitting: Ophthalmology

## 2019-08-14 ENCOUNTER — Other Ambulatory Visit: Payer: Self-pay

## 2019-08-14 ENCOUNTER — Encounter: Payer: Self-pay | Admitting: Ophthalmology

## 2019-08-19 ENCOUNTER — Other Ambulatory Visit: Admission: RE | Admit: 2019-08-19 | Payer: Medicare Other | Source: Ambulatory Visit

## 2019-09-11 ENCOUNTER — Encounter: Payer: Self-pay | Admitting: Ophthalmology

## 2019-09-11 ENCOUNTER — Other Ambulatory Visit: Payer: Self-pay

## 2019-09-13 ENCOUNTER — Other Ambulatory Visit: Payer: Self-pay

## 2019-09-13 ENCOUNTER — Other Ambulatory Visit
Admission: RE | Admit: 2019-09-13 | Discharge: 2019-09-13 | Disposition: A | Discharge status: Home / Self Care | Payer: Medicare Other | Source: Ambulatory Visit | Attending: Ophthalmology | Admitting: Ophthalmology

## 2019-09-13 DIAGNOSIS — Z01812 Encounter for preprocedural laboratory examination: Secondary | ICD-10-CM | POA: Insufficient documentation

## 2019-09-13 DIAGNOSIS — Z20822 Contact with and (suspected) exposure to covid-19: Secondary | ICD-10-CM | POA: Diagnosis not present

## 2019-09-13 LAB — SARS CORONAVIRUS 2 (TAT 6-24 HRS): SARS Coronavirus 2: NEGATIVE

## 2019-09-17 NOTE — Discharge Instructions (Signed)

## 2019-09-18 ENCOUNTER — Ambulatory Visit
Admission: RE | Admit: 2019-09-18 | Discharge: 2019-09-18 | Disposition: A | Discharge status: Home / Self Care | Payer: Medicare Other | Attending: Ophthalmology | Admitting: Ophthalmology

## 2019-09-18 ENCOUNTER — Other Ambulatory Visit: Payer: Self-pay

## 2019-09-18 ENCOUNTER — Encounter
Admission: RE | Disposition: A | Discharge status: Home / Self Care | Payer: Self-pay | Source: Home / Self Care | Attending: Ophthalmology

## 2019-09-18 ENCOUNTER — Encounter: Payer: Self-pay | Admitting: Anesthesiology

## 2019-09-18 ENCOUNTER — Encounter: Payer: Self-pay | Admitting: Ophthalmology

## 2019-09-18 DIAGNOSIS — H2512 Age-related nuclear cataract, left eye: Secondary | ICD-10-CM | POA: Insufficient documentation

## 2019-09-18 DIAGNOSIS — Z9841 Cataract extraction status, right eye: Secondary | ICD-10-CM | POA: Diagnosis not present

## 2019-09-18 DIAGNOSIS — Z79899 Other long term (current) drug therapy: Secondary | ICD-10-CM | POA: Diagnosis not present

## 2019-09-18 DIAGNOSIS — Z853 Personal history of malignant neoplasm of breast: Secondary | ICD-10-CM | POA: Diagnosis not present

## 2019-09-18 DIAGNOSIS — J449 Chronic obstructive pulmonary disease, unspecified: Secondary | ICD-10-CM | POA: Diagnosis not present

## 2019-09-18 DIAGNOSIS — Z87891 Personal history of nicotine dependence: Secondary | ICD-10-CM | POA: Insufficient documentation

## 2019-09-18 HISTORY — PX: CATARACT EXTRACTION W/PHACO: SHX586

## 2019-09-18 HISTORY — DX: Headache, unspecified: R51.9

## 2019-09-18 HISTORY — DX: Chronic obstructive pulmonary disease, unspecified: J44.9

## 2019-09-18 SURGERY — PHACOEMULSIFICATION, CATARACT, WITH IOL INSERTION
Anesthesia: Monitor Anesthesia Care | Site: Eye | Laterality: Left

## 2019-09-18 MED ORDER — LIDOCAINE HCL (PF) 2 % IJ SOLN
INTRAOCULAR | Status: DC | PRN
  Administered 2019-09-18: 1 mL

## 2019-09-18 MED ORDER — BRIMONIDINE TARTRATE-TIMOLOL 0.2-0.5 % OP SOLN
OPHTHALMIC | Status: DC | PRN
  Administered 2019-09-18: 1 [drp] via OPHTHALMIC

## 2019-09-18 MED ORDER — NA HYALUR & NA CHOND-NA HYALUR 0.4-0.35 ML IO KIT
PACK | INTRAOCULAR | Status: DC | PRN
  Administered 2019-09-18: 1 mL via INTRAOCULAR

## 2019-09-18 MED ORDER — CEFUROXIME OPHTHALMIC INJECTION 1 MG/0.1 ML
INJECTION | OPHTHALMIC | Status: DC | PRN
  Administered 2019-09-18: 0.1 mL via INTRACAMERAL

## 2019-09-18 MED ORDER — MOXIFLOXACIN HCL 0.5 % OP SOLN
1.0000 [drp] | OPHTHALMIC | Status: DC | PRN
  Administered 2019-09-18 (×3): 1 [drp] via OPHTHALMIC

## 2019-09-18 MED ORDER — FENTANYL CITRATE (PF) 100 MCG/2ML IJ SOLN
INTRAMUSCULAR | Status: DC | PRN
Start: 2019-09-18 — End: 2019-09-18
  Administered 2019-09-18: 50 ug via INTRAVENOUS

## 2019-09-18 MED ORDER — TETRACAINE HCL 0.5 % OP SOLN
1.0000 [drp] | OPHTHALMIC | Status: DC | PRN
  Administered 2019-09-18 (×3): 1 [drp] via OPHTHALMIC

## 2019-09-18 MED ORDER — ARMC OPHTHALMIC DILATING DROPS
1.0000 "application " | OPHTHALMIC | Status: DC | PRN
  Administered 2019-09-18 (×3): 1 via OPHTHALMIC

## 2019-09-18 MED ORDER — LACTATED RINGERS IV SOLN: INTRAVENOUS | Status: DC

## 2019-09-18 MED ORDER — MIDAZOLAM HCL 2 MG/2ML IJ SOLN
INTRAMUSCULAR | Status: DC | PRN
  Administered 2019-09-18: 1 mg via INTRAVENOUS

## 2019-09-18 MED ORDER — EPINEPHRINE PF 1 MG/ML IJ SOLN
INTRAOCULAR | Status: DC | PRN
  Administered 2019-09-18: 81 mL via OPHTHALMIC

## 2019-09-18 SURGICAL SUPPLY — 24 items
CANNULA ANT/CHMB 27G (MISCELLANEOUS) ×1 IMPLANT
CANNULA ANT/CHMB 27GA (MISCELLANEOUS) ×3 IMPLANT
GLOVE SURG LX 7.5 STRW (GLOVE) ×2
GLOVE SURG LX STRL 7.5 STRW (GLOVE) ×1 IMPLANT
GLOVE SURG TRIUMPH 8.0 PF LTX (GLOVE) ×3 IMPLANT
GOWN STRL REUS W/ TWL LRG LVL3 (GOWN DISPOSABLE) ×2 IMPLANT
GOWN STRL REUS W/TWL LRG LVL3 (GOWN DISPOSABLE) ×6
LENS IOL IQ PAN TRC 30 16.5 IMPLANT
LENS IOL PANOP TORIC 30 16.5 ×1 IMPLANT
LENS IOL PANOPTIX TORIC 16.5 ×3 IMPLANT
MARKER SKIN DUAL TIP RULER LAB (MISCELLANEOUS) ×3 IMPLANT
NDL CAPSULORHEX 25GA (NEEDLE) ×1 IMPLANT
NDL FILTER BLUNT 18X1 1/2 (NEEDLE) ×2 IMPLANT
NEEDLE CAPSULORHEX 25GA (NEEDLE) ×3 IMPLANT
NEEDLE FILTER BLUNT 18X 1/2SAF (NEEDLE) ×4
NEEDLE FILTER BLUNT 18X1 1/2 (NEEDLE) ×2 IMPLANT
PACK CATARACT BRASINGTON (MISCELLANEOUS) ×3 IMPLANT
PACK EYE AFTER SURG (MISCELLANEOUS) ×3 IMPLANT
PACK OPTHALMIC (MISCELLANEOUS) ×3 IMPLANT
SOLUTION OPHTHALMIC SALT (MISCELLANEOUS) ×3 IMPLANT
SYR 3ML LL SCALE MARK (SYRINGE) ×6 IMPLANT
SYR TB 1ML LUER SLIP (SYRINGE) ×3 IMPLANT
WATER STERILE IRR 250ML POUR (IV SOLUTION) ×3 IMPLANT
WIPE NON LINTING 3.25X3.25 (MISCELLANEOUS) ×3 IMPLANT

## 2019-09-18 NOTE — Transfer of Care (Signed)
Immediate Anesthesia Transfer of Care Note  Patient: Kathryn Hart  Procedure(s) Performed: CATARACT EXTRACTION PHACO AND INTRAOCULAR LENS PLACEMENT ([facility]) LEFT PANOPTIX TORIC LENS 13.51  01:21.1  16.7% (Left Eye)  Patient Location: PACU  Anesthesia Type: MAC  Level of Consciousness: awake, alert  and patient cooperative  Airway and Oxygen Therapy: Patient Spontanous Breathing and Patient connected to supplemental oxygen  Post-op Assessment: Post-op Vital signs reviewed, Patient's Cardiovascular Status Stable, Respiratory Function Stable, Patent Airway and No signs of Nausea or vomiting  Post-op Vital Signs: Reviewed and stable  Complications: No complications documented.

## 2019-09-18 NOTE — H&P (Signed)

## 2019-09-18 NOTE — Anesthesia Preprocedure Evaluation (Signed)
Anesthesia Evaluation  Patient identified by MRN, date of birth, ID band Patient awake    Reviewed: Allergy & Precautions, NPO status , Patient's Chart, lab work & pertinent test results, reviewed documented beta blocker date and time   History of Anesthesia Complications Negative for: history of anesthetic complications  Airway Mallampati: II  TM Distance: >3 FB Neck ROM: Full    Dental no notable dental hx.    Pulmonary COPD, former smoker,    Pulmonary exam normal        Cardiovascular negative cardio ROS Normal cardiovascular exam     Neuro/Psych  Headaches, PSYCHIATRIC DISORDERS (PTSD)    GI/Hepatic negative GI ROS, Neg liver ROS,   Endo/Other  negative endocrine ROS  Renal/GU negative Renal ROS     Musculoskeletal negative musculoskeletal ROS (+)   Abdominal Normal abdominal exam  (+)   Peds  Hematology negative hematology ROS (+)   Anesthesia Other Findings L breast cancer  Reproductive/Obstetrics                             Anesthesia Physical  Anesthesia Plan  ASA: II  Anesthesia Plan: MAC   Post-op Pain Management:    Induction: Intravenous  PONV Risk Score and Plan: 2 and TIVA, Midazolam and Treatment may vary due to age or medical condition  Airway Management Planned: Nasal Cannula and Natural Airway  Additional Equipment:   Intra-op Plan:   Post-operative Plan:   Informed Consent: I have reviewed the patients History and Physical, chart, labs and discussed the procedure including the risks, benefits and alternatives for the proposed anesthesia with the patient or authorized representative who has indicated his/her understanding and acceptance.     Dental advisory given  Plan Discussed with: CRNA  Anesthesia Plan Comments:         Anesthesia Quick Evaluation

## 2019-09-18 NOTE — Anesthesia Procedure Notes (Signed)
Procedure Name: MAC Performed by: Conita Amenta, CRNA Pre-anesthesia Checklist: Patient identified, Emergency Drugs available, Suction available, Timeout performed and Patient being monitored Patient Re-evaluated:Patient Re-evaluated prior to induction Oxygen Delivery Method: Nasal cannula Placement Confirmation: positive ETCO2       

## 2019-09-18 NOTE — Anesthesia Postprocedure Evaluation (Signed)
Anesthesia Post Note  Patient: Kathryn Hart  Procedure(s) Performed: CATARACT EXTRACTION PHACO AND INTRAOCULAR LENS PLACEMENT ([facility]) LEFT PANOPTIX TORIC LENS 13.51  01:21.1  16.7% (Left Eye)     Anesthesia Post Evaluation No complications documented.  Taedyn Glasscock [employer]

## 2019-09-18 NOTE — Op Note (Signed)
LOCATION:  [place]   PREOPERATIVE DIAGNOSIS:  Nuclear sclerotic cataract of the left eye.  H25.12  POSTOPERATIVE DIAGNOSIS:  Nuclear sclerotic cataract of the left eye.   PROCEDURE:  Phacoemulsification with Toric posterior chamber intraocular lens placement of the left eye.  Ultrasound time: Procedure(s): CATARACT EXTRACTION PHACO AND INTRAOCULAR LENS PLACEMENT ([facility]) LEFT PANOPTIX TORIC LENS 13.51  01:21.1  16.7% (Left) LENS:TFNT30 16.5 Panoptix Toric intraocular lens with 1.5 diopters of cylindrical power with axis orientation at 146 degrees.    SURGEON:  Wyonia Hough, MD   ANESTHESIA:  Topical with tetracaine drops and 2% Xylocaine jelly, augmented with 1% preservative-free intracameral lidocaine.  COMPLICATIONS:  None.   DESCRIPTION OF PROCEDURE:  The patient was identified in the holding room and transported to the operating suite and placed in the supine position under the operating microscope.  The left eye was identified as the operative eye, and it was prepped and draped in the usual sterile ophthalmic fashion.    A clear-corneal paracentesis incision was made at the 1:30 position.  0.5 ml of preservative-free 1% lidocaine was injected into the anterior chamber. The anterior chamber was filled with Viscoat.  A 2.4 millimeter near clear corneal incision was then made at the 10:30 position.  A cystotome and capsulorrhexis forceps were then used to make a curvilinear capsulorrhexis.  Hydrodissection and hydrodelineation were then performed using balanced salt solution.   Phacoemulsification was then used in stop and chop fashion to remove the lens, nucleus and epinucleus.  The remaining cortex was aspirated using the irrigation and aspiration handpiece.  Provisc viscoelastic was then placed into the capsular bag to distend it for lens placement.  The Verion digital marker was used to align the implant at the intended axis.   A 16.5 diopter lens was then injected  into the capsular bag.  It was rotated clockwise until the axis marks on the lens were approximately 15 degrees in the counterclockwise direction to the intended alignment.  The viscoelastic was aspirated from the eye using the irrigation aspiration handpiece.  Then, a Koch spatula through the sideport incision was used to rotate the lens in a clockwise direction until the axis markings of the intraocular lens were lined up with the Verion alignment.  Balanced salt solution was then used to hydrate the wounds. Cefuroxime 0.1 ml of a 10mg /ml solution was injected into the anterior chamber for a dose of 1 mg of intracameral antibiotic at the completion of the case.    The eye was noted to have a physiologic pressure and there was no wound leak noted.   Timolol and Brimonidine drops were applied to the eye.  The patient was taken to the recovery room in stable condition having had no complications of anesthesia or surgery.  Kathryn Hart 09/18/2019, 9:36 AM

## 2019-09-19 ENCOUNTER — Encounter: Payer: Self-pay | Admitting: Ophthalmology

## 2020-04-09 ENCOUNTER — Other Ambulatory Visit: Payer: Self-pay | Admitting: Family Medicine

## 2020-04-09 DIAGNOSIS — Z780 Asymptomatic menopausal state: Secondary | ICD-10-CM

## 2020-04-10 ENCOUNTER — Other Ambulatory Visit: Payer: Self-pay | Admitting: *Deleted

## 2020-04-10 ENCOUNTER — Inpatient Hospital Stay
Admission: RE | Admit: 2020-04-10 | Discharge: 2020-04-10 | Disposition: A | Discharge status: Home / Self Care | Payer: Self-pay | Source: Ambulatory Visit | Attending: *Deleted | Admitting: *Deleted

## 2020-04-10 DIAGNOSIS — Z1231 Encounter for screening mammogram for malignant neoplasm of breast: Secondary | ICD-10-CM

## 2020-04-14 ENCOUNTER — Other Ambulatory Visit: Payer: Self-pay | Admitting: Family Medicine

## 2020-04-14 DIAGNOSIS — Z1231 Encounter for screening mammogram for malignant neoplasm of breast: Secondary | ICD-10-CM

## 2020-05-12 ENCOUNTER — Ambulatory Visit
Admission: RE | Admit: 2020-05-12 | Discharge: 2020-05-12 | Disposition: A | Discharge status: Home / Self Care | Payer: Medicare Other | Source: Ambulatory Visit | Attending: Family Medicine | Admitting: Family Medicine

## 2020-05-12 ENCOUNTER — Other Ambulatory Visit: Payer: Self-pay

## 2020-05-12 DIAGNOSIS — Z1231 Encounter for screening mammogram for malignant neoplasm of breast: Secondary | ICD-10-CM

## 2020-05-14 ENCOUNTER — Other Ambulatory Visit: Payer: Self-pay | Admitting: Family Medicine

## 2020-05-14 DIAGNOSIS — E041 Nontoxic single thyroid nodule: Secondary | ICD-10-CM

## 2020-07-14 ENCOUNTER — Ambulatory Visit: Payer: Medicare Other

## 2020-09-22 ENCOUNTER — Emergency Department: Payer: Medicare Other

## 2020-09-22 ENCOUNTER — Other Ambulatory Visit: Payer: Self-pay

## 2020-09-22 ENCOUNTER — Emergency Department
Admission: EM | Admit: 2020-09-22 | Discharge: 2020-09-22 | Disposition: A | Discharge status: Home / Self Care | Payer: Medicare Other | Attending: Emergency Medicine | Admitting: Emergency Medicine

## 2020-09-22 DIAGNOSIS — S8992XA Unspecified injury of left lower leg, initial encounter: Secondary | ICD-10-CM | POA: Diagnosis not present

## 2020-09-22 DIAGNOSIS — J449 Chronic obstructive pulmonary disease, unspecified: Secondary | ICD-10-CM | POA: Insufficient documentation

## 2020-09-22 DIAGNOSIS — W01198A Fall on same level from slipping, tripping and stumbling with subsequent striking against other object, initial encounter: Secondary | ICD-10-CM | POA: Diagnosis not present

## 2020-09-22 DIAGNOSIS — Y9301 Activity, walking, marching and hiking: Secondary | ICD-10-CM | POA: Diagnosis not present

## 2020-09-22 DIAGNOSIS — Z853 Personal history of malignant neoplasm of breast: Secondary | ICD-10-CM | POA: Insufficient documentation

## 2020-09-22 DIAGNOSIS — W19XXXA Unspecified fall, initial encounter: Secondary | ICD-10-CM

## 2020-09-22 DIAGNOSIS — S0083XA Contusion of other part of head, initial encounter: Secondary | ICD-10-CM | POA: Insufficient documentation

## 2020-09-22 DIAGNOSIS — S0990XA Unspecified injury of head, initial encounter: Secondary | ICD-10-CM | POA: Diagnosis present

## 2020-09-22 DIAGNOSIS — S4992XA Unspecified injury of left shoulder and upper arm, initial encounter: Secondary | ICD-10-CM | POA: Diagnosis not present

## 2020-09-22 DIAGNOSIS — Z87891 Personal history of nicotine dependence: Secondary | ICD-10-CM | POA: Insufficient documentation

## 2020-09-22 DIAGNOSIS — Y92512 Supermarket, store or market as the place of occurrence of the external cause: Secondary | ICD-10-CM | POA: Diagnosis not present

## 2020-09-22 NOTE — ED Triage Notes (Signed)
First nurse note: per ems pt at store, tripped and fell hitting head. Pt denies LOC, does not take blood thinners. CBG 107

## 2020-09-22 NOTE — ED Notes (Signed)
See triage note  presents s/p fall  states she tripped on rug in store  hitting her head  left shoulder and knee

## 2020-09-22 NOTE — ED Triage Notes (Signed)
Pt here via [facility] with a fall today at the store. Pt tripped over a mat and hit her head, left shoulder, and left knee. Pt denies LOC.

## 2020-09-22 NOTE — ED Provider Notes (Signed)
[facility] Emergency Department Provider Note ____________________________________________   Event Date/Time   First MD Initiated Contact with Patient 09/22/20 1043     (approximate)  I have reviewed the triage vital signs and the nursing notes.  HISTORY  Chief Complaint Fall   HPI Kathryn Hart is a 75 y.o. femalewho presents to the ED for evaluation of mechanical fall.   Chart review indicates no AC.   Patient is quite healthy and functional.  She goes on runs regularly, walks independently every day.   Patient reports walking through a [employer] when she accidentally tripped on a rolled up floor mat in the produce section, causing a mechanical fall onto her left side.  She reports left knee, left shoulder and left-sided temporal trauma.  Does not think she passed out.  She was able to get herself back up and ambulate after this fall.  Denies subsequent episodes of syncope, emesis, blurry vision, pain with eye movement.  Reports feeling okay right now.  Husband, at the bedside, reports that she seems okay, but is concerned with a hematoma on her left-sided periorbital region.  Past Medical History:  Diagnosis Date   Cancer ([place]) 05/2014   left sided breast cancer   COPD (chronic obstructive pulmonary disease) (HCC)    mild   Headache    Personal history of radiation therapy 2016   No Chemo    Patient Active Problem List   Diagnosis Date Noted   Syncope 09/29/2016    Past Surgical History:  Procedure Laterality Date   ABDOMINAL HYSTERECTOMY     CATARACT EXTRACTION W/PHACO Right 07/17/2019   Procedure: CATARACT EXTRACTION PHACO AND INTRAOCULAR LENS PLACEMENT ([place]) RIGHT PANOPTIX  TORIC LENS;  Surgeon: Leandrew Koyanagi, MD;  Location: [place];  Service: Ophthalmology;  Laterality: Right;  10.72 1:20.2 13.3%   CATARACT EXTRACTION W/PHACO Left 09/18/2019   Procedure: CATARACT EXTRACTION PHACO AND INTRAOCULAR LENS  PLACEMENT ([facility]) LEFT PANOPTIX TORIC LENS 13.51  01:21.1  16.7%;  Surgeon: Leandrew Koyanagi, MD;  Location: [place];  Service: Ophthalmology;  Laterality: Left;   MASTECTOMY, PARTIAL Left 05/2014   partial    Prior to Admission medications   Medication Sig Start Date End Date Taking? Authorizing Provider  buPROPion (WELLBUTRIN SR) 150 MG 12 hr tablet Take 150 mg by mouth daily.    [provider]  cholecalciferol (VITAMIN D3) 25 MCG (1000 UNIT) tablet Take 2,000 Units by mouth daily.    [provider]  traZODone (DESYREL) 100 MG tablet Take 100 mg by mouth at bedtime.    [provider]    Allergies Patient has no known allergies.  History reviewed. No pertinent family history.  Social History Social History   Tobacco Use   Smoking status: Former    Packs/day: 0.50    Years: 20.00    Pack years: 10.00    Types: Cigarettes    Quit date: 02/10/2018    Years since quitting: 2.6   Smokeless tobacco: Never  Vaping Use   Vaping Use: Never used  Substance Use Topics   Alcohol use: Not Currently    Comment:      Review of Systems  Constitutional: No fever/chills Eyes: No visual changes. ENT: No sore throat. Cardiovascular: Denies chest pain. Respiratory: Denies shortness of breath. Gastrointestinal: No abdominal pain.  No nausea, no vomiting.  No diarrhea.  No constipation. Genitourinary: Negative for dysuria. Musculoskeletal: Negative for back pain. Positive for left shoulder, left knee  and left-sided head pain. Skin: Negative for rash. Neurological: Negative for  focal weakness or numbness.  ____________________________________________   PHYSICAL EXAM:  VITAL SIGNS: Vitals:   09/22/20 0946  BP: (!) 158/90  Pulse: 83  Resp: 16  Temp: 98.1 F (36.7 C)  SpO2: 99%      Constitutional: Alert and oriented. Well appearing and in no acute distress. Eyes: Conjunctivae are normal. PERRL. EOMI. Head: 3 cm circular hematoma  that is closed and nonbleeding to lateral aspect of left periorbital region, just anterior to her temporum.  No step-offs.  No signs of EOM entrapment. Nose: No congestion/rhinnorhea. Mouth/Throat: Mucous membranes are moist.  Oropharynx non-erythematous. Neck: No stridor. No cervical spine tenderness to palpation. Cardiovascular: Normal rate, regular rhythm. Grossly normal heart sounds.  Good peripheral circulation. Respiratory: Normal respiratory effort.  No retractions. Lungs CTAB. Gastrointestinal: Soft , nondistended, nontender to palpation. No CVA tenderness. Musculoskeletal: No lower extremity tenderness nor edema.  No joint effusions.  Minimal tenderness diffusely over the lateral aspect of the left knee without external swelling or signs of significant trauma.  Full active and passive ROM of the left knee, ankle and hip without pain or impaired ranging. Left shoulder has no external signs of trauma and has full range of motion active and passively without pain. Palpation of all 4 extremities otherwise without evidence of deformity, trauma or tenderness. Neurologic:  Normal speech and language. No gross focal neurologic deficits are appreciated. No gait instability noted. Cranial nerves II through XII intact 5/5 strength and sensation in all 4 extremities Skin:  Skin is warm, dry and intact. No rash noted. Psychiatric: Mood and affect are normal. Speech and behavior are normal.  ____________________________________________   LABS (all labs ordered are listed, but only abnormal results are displayed)  Labs Reviewed - No data to display ____________________________________________  12 Lead EKG   ____________________________________________  RADIOLOGY  ED MD interpretation: Plain films of the left shoulder left knee reviewed by me without evidence of fracture or dislocation. CT head reviewed by me without evidence of acute intracranial pathology or skull fracture  Official  radiology report(s): CT HEAD WO CONTRAST (5MM)  Result Date: 09/22/2020 CLINICAL DATA:  Fall EXAM: CT HEAD WITHOUT CONTRAST TECHNIQUE: Contiguous axial images were obtained from the base of the skull through the vertex without intravenous contrast. COMPARISON:  CT head 09/29/2016 FINDINGS: Brain: There is no evidence of acute intracranial hemorrhage, extra-axial fluid collection, or infarct. The ventricles are not enlarged. There is no mass lesion. There is no midline shift. Vascular: There is calcification of the bilateral cavernous ICAs. Skull: Normal. Negative for fracture or focal lesion. Sinuses/Orbits: The imaged paranasal sinuses are clear. Bilateral lens implants are in place. The globes and orbits are otherwise unremarkable. Other: There is mild left periorbital soft tissue swelling without underlying fracture. IMPRESSION: 1. No acute intracranial hemorrhage. 2. Mild left periorbital soft tissue swelling without underlying fracture. Electronically Signed   By: Valetta Mole M.D.   On: 09/22/2020 10:41   DG Shoulder Left  Result Date: 09/22/2020 CLINICAL DATA:  Left shoulder pain after fall. EXAM: LEFT SHOULDER - 2+ VIEW COMPARISON:  None. FINDINGS: There is no evidence of fracture or dislocation. There is no evidence of arthropathy or other focal bone abnormality. Soft tissues are unremarkable. IMPRESSION: Negative. Electronically Signed   By: Marijo Conception M.D.   On: 09/22/2020 10:38   DG Knee Complete 4 Views Left  Result Date: 09/22/2020 CLINICAL DATA:  Fall EXAM: LEFT KNEE -  COMPLETE 4+ VIEW COMPARISON:  None. FINDINGS: There is no acute fracture or dislocation. Knee alignment is normal. There is mild narrowing of the tibiofemoral joint spaces. There is no effusion. IMPRESSION: Unremarkable knee radiographs. Electronically Signed   By: Valetta Mole M.D.   On: 09/22/2020 10:37    ____________________________________________   PROCEDURES and INTERVENTIONS  Procedure(s) performed  (including Critical Care):  Procedures  Medications - No data to display  ____________________________________________   MDM / ED COURSE   Quite healthy and functional 75 year old woman not on anticoagulation presents to the ED after mechanical fall, without evidence of significant injury, and amenable to outpatient management.  Left lateral periorbital hematoma without evidence of EOM entrapment.  Minimal tenderness to the left knee, but no evidence of open injury or impaired range of motion.  Plain films of the left shoulder and left knee are reassuring without evidence of fracture or dislocation.  CT head without evidence of ICH or temporal fracture.  No significant injury to preclude outpatient management.  We will discharge with return precautions.     ____________________________________________   FINAL CLINICAL IMPRESSION(S) / ED DIAGNOSES  Final diagnoses:  Fall, initial encounter  Injury of head, initial encounter     ED Discharge Orders     None        Alani Lacivita Tamala Julian   Note:  This document was prepared using Dragon voice recognition software and may include unintentional dictation errors.    Vladimir Crofts, MD 09/22/20 1125

## 2020-09-22 NOTE — Discharge Instructions (Signed)
Use Tylenol for pain and fevers.  Up to 1000 mg per dose, up to 4 times per day.  Do not take more than 4000 mg of Tylenol/acetaminophen within 24 hours..  

## 2021-07-27 ENCOUNTER — Other Ambulatory Visit: Payer: Self-pay | Admitting: Family Medicine

## 2021-07-27 DIAGNOSIS — Z1231 Encounter for screening mammogram for malignant neoplasm of breast: Secondary | ICD-10-CM

## 2021-08-05 ENCOUNTER — Other Ambulatory Visit: Payer: Self-pay | Admitting: Family Medicine

## 2021-08-05 DIAGNOSIS — Z780 Asymptomatic menopausal state: Secondary | ICD-10-CM

## 2021-08-17 ENCOUNTER — Ambulatory Visit: Payer: Medicare Other

## 2021-08-24 ENCOUNTER — Ambulatory Visit
Admission: RE | Admit: 2021-08-24 | Discharge: 2021-08-24 | Disposition: A | Discharge status: Home / Self Care | Payer: Medicare Other | Source: Ambulatory Visit | Attending: Family Medicine | Admitting: Family Medicine

## 2021-08-24 DIAGNOSIS — Z1231 Encounter for screening mammogram for malignant neoplasm of breast: Secondary | ICD-10-CM | POA: Insufficient documentation

## 2021-08-24 DIAGNOSIS — Z780 Asymptomatic menopausal state: Secondary | ICD-10-CM

## 2021-09-16 ENCOUNTER — Other Ambulatory Visit (HOSPITAL_COMMUNITY): Payer: Self-pay | Admitting: Family Medicine

## 2021-09-16 ENCOUNTER — Ambulatory Visit
Admission: RE | Admit: 2021-09-16 | Discharge: 2021-09-16 | Disposition: A | Discharge status: Home / Self Care | Payer: Medicare Other | Source: Ambulatory Visit | Attending: Family Medicine | Admitting: Family Medicine

## 2021-09-16 DIAGNOSIS — S32000A Wedge compression fracture of unspecified lumbar vertebra, initial encounter for closed fracture: Secondary | ICD-10-CM | POA: Insufficient documentation

## 2021-09-16 DIAGNOSIS — M545 Low back pain, unspecified: Secondary | ICD-10-CM | POA: Insufficient documentation

## 2021-09-16 MED ORDER — GADOBUTROL 1 MMOL/ML IV SOLN
5.0000 mL | Freq: Once | INTRAVENOUS | Status: AC | PRN
  Administered 2021-09-16: 5 mL via INTRAVENOUS

## 2022-01-13 ENCOUNTER — Other Ambulatory Visit: Payer: Self-pay | Admitting: Family Medicine

## 2022-01-13 DIAGNOSIS — H532 Diplopia: Secondary | ICD-10-CM

## 2022-01-13 DIAGNOSIS — S060X0A Concussion without loss of consciousness, initial encounter: Secondary | ICD-10-CM

## 2022-01-20 ENCOUNTER — Ambulatory Visit: Payer: Medicare Other | Attending: Family Medicine

## 2022-07-21 ENCOUNTER — Encounter: Payer: Self-pay | Admitting: Ophthalmology

## 2022-07-22 NOTE — Anesthesia Preprocedure Evaluation (Addendum)
Anesthesia Evaluation  Patient identified by MRN, date of birth, ID band Patient awake    Reviewed: Allergy & Precautions, H&P , NPO status , Patient's Chart, lab work & pertinent test results  Airway Mallampati: II  TM Distance: >3 FB Neck ROM: Full    Dental no notable dental hx. (+) Partial Upper Partial stays in place well, is glued in:   Pulmonary neg pulmonary ROS, COPD, former smoker   Pulmonary exam normal breath sounds clear to auscultation       Cardiovascular negative cardio ROS Normal cardiovascular exam Rhythm:Regular Rate:Normal     Neuro/Psych  Headaches negative neurological ROS  negative psych ROS   GI/Hepatic negative GI ROS, Neg liver ROS,,,  Endo/Other  negative endocrine ROS    Renal/GU negative Renal ROS  negative genitourinary   Musculoskeletal negative musculoskeletal ROS (+)    Abdominal   Peds negative pediatric ROS (+)  Hematology negative hematology ROS (+)   Anesthesia Other Findings COPD  Headache Cancer  Personal history of radiation therapy Wears partial dentures  Hx left breast cancer  Reproductive/Obstetrics negative OB ROS                              Anesthesia Physical Anesthesia Plan  ASA: 2  Anesthesia Plan: MAC   Post-op Pain Management:    Induction: Intravenous  PONV Risk Score and Plan:   Airway Management Planned: Natural Airway and Nasal Cannula  Additional Equipment:   Intra-op Plan:   Post-operative Plan:   Informed Consent: I have reviewed the patients History and Physical, chart, labs and discussed the procedure including the risks, benefits and alternatives for the proposed anesthesia with the patient or authorized representative who has indicated his/her understanding and acceptance.     Dental Advisory Given  Plan Discussed with: Anesthesiologist, CRNA and Surgeon  Anesthesia Plan Comments: (Patient consented  for risks of anesthesia including but not limited to:  - adverse reactions to medications - damage to eyes, teeth, lips or other oral mucosa - nerve damage due to positioning  - sore throat or hoarseness - Damage to heart, brain, nerves, lungs, other parts of body or loss of life  Patient voiced understanding.)         Anesthesia Quick Evaluation

## 2022-07-27 NOTE — Discharge Instructions (Signed)

## 2022-08-01 ENCOUNTER — Encounter: Payer: Self-pay | Admitting: Family Medicine

## 2022-08-01 ENCOUNTER — Other Ambulatory Visit: Payer: Self-pay | Admitting: Family Medicine

## 2022-08-01 DIAGNOSIS — Z1231 Encounter for screening mammogram for malignant neoplasm of breast: Secondary | ICD-10-CM

## 2022-08-29 ENCOUNTER — Ambulatory Visit
Admission: RE | Admit: 2022-08-29 | Discharge: 2022-08-29 | Disposition: A | Discharge status: Home / Self Care | Payer: Medicare Other | Source: Ambulatory Visit | Attending: Family Medicine | Admitting: Family Medicine

## 2022-08-29 DIAGNOSIS — Z1231 Encounter for screening mammogram for malignant neoplasm of breast: Secondary | ICD-10-CM | POA: Insufficient documentation

## 2022-09-15 ENCOUNTER — Other Ambulatory Visit: Payer: Self-pay | Admitting: Spine Surgery

## 2022-09-15 DIAGNOSIS — M810 Age-related osteoporosis without current pathological fracture: Secondary | ICD-10-CM

## 2022-09-15 DIAGNOSIS — M4316 Spondylolisthesis, lumbar region: Secondary | ICD-10-CM

## 2022-09-21 ENCOUNTER — Encounter: Payer: Self-pay | Admitting: Ophthalmology

## 2022-09-30 ENCOUNTER — Encounter
Admission: RE | Disposition: A | Discharge status: Home / Self Care | Payer: Self-pay | Source: Home / Self Care | Attending: Ophthalmology

## 2022-09-30 ENCOUNTER — Encounter: Payer: Self-pay | Admitting: Ophthalmology

## 2022-09-30 ENCOUNTER — Other Ambulatory Visit: Payer: Self-pay

## 2022-09-30 ENCOUNTER — Ambulatory Visit: Payer: Medicare Other | Admitting: Anesthesiology

## 2022-09-30 ENCOUNTER — Ambulatory Visit
Admission: RE | Admit: 2022-09-30 | Discharge: 2022-09-30 | Disposition: A | Discharge status: Home / Self Care | Payer: Medicare Other | Attending: Ophthalmology | Admitting: Ophthalmology

## 2022-09-30 DIAGNOSIS — H02834 Dermatochalasis of left upper eyelid: Secondary | ICD-10-CM | POA: Diagnosis present

## 2022-09-30 DIAGNOSIS — R519 Headache, unspecified: Secondary | ICD-10-CM | POA: Insufficient documentation

## 2022-09-30 DIAGNOSIS — H02831 Dermatochalasis of right upper eyelid: Secondary | ICD-10-CM | POA: Insufficient documentation

## 2022-09-30 DIAGNOSIS — J449 Chronic obstructive pulmonary disease, unspecified: Secondary | ICD-10-CM | POA: Diagnosis not present

## 2022-09-30 DIAGNOSIS — Z87891 Personal history of nicotine dependence: Secondary | ICD-10-CM | POA: Diagnosis not present

## 2022-09-30 HISTORY — PX: BROW LIFT: SHX178

## 2022-09-30 HISTORY — DX: Presence of dental prosthetic device (complete) (partial): Z97.2

## 2022-09-30 SURGERY — BLEPHAROPLASTY
Anesthesia: General | Laterality: Bilateral

## 2022-09-30 MED ORDER — ERYTHROMYCIN 5 MG/GM OP OINT: TOPICAL_OINTMENT | OPHTHALMIC | 2 refills | Status: DC

## 2022-09-30 MED ORDER — TRAMADOL HCL 50 MG PO TABS: ORAL_TABLET | ORAL | 0 refills | Status: DC

## 2022-09-30 MED ORDER — LACTATED RINGERS IV SOLN: INTRAVENOUS | Status: DC

## 2022-09-30 MED ORDER — TETRACAINE HCL 0.5 % OP SOLN
OPHTHALMIC | Status: DC | PRN
  Administered 2022-09-30: 2 [drp] via OPHTHALMIC

## 2022-09-30 MED ORDER — MIDAZOLAM HCL 2 MG/2ML IJ SOLN
INTRAMUSCULAR | Status: DC | PRN
  Administered 2022-09-30: 2 mg via INTRAVENOUS

## 2022-09-30 MED ORDER — ERYTHROMYCIN 5 MG/GM OP OINT
TOPICAL_OINTMENT | OPHTHALMIC | Status: DC | PRN
  Administered 2022-09-30: 1 via OPHTHALMIC

## 2022-09-30 MED ORDER — LIDOCAINE-EPINEPHRINE 2 %-1:100000 IJ SOLN
INTRAMUSCULAR | Status: DC | PRN
  Administered 2022-09-30: 1.5 mL via OPHTHALMIC

## 2022-09-30 MED ORDER — MIDAZOLAM HCL 2 MG/2ML IJ SOLN
INTRAMUSCULAR | Status: AC
  Filled 2022-09-30: qty 2

## 2022-09-30 MED ORDER — ONDANSETRON HCL 4 MG/2ML IJ SOLN
INTRAMUSCULAR | Status: AC
  Filled 2022-09-30: qty 2

## 2022-09-30 MED ORDER — ONDANSETRON HCL 4 MG/2ML IJ SOLN
INTRAMUSCULAR | Status: DC | PRN
  Administered 2022-09-30: 4 mg via INTRAVENOUS

## 2022-09-30 MED ORDER — BSS IO SOLN
INTRAOCULAR | Status: DC | PRN
  Administered 2022-09-30: 15 mL via INTRAOCULAR

## 2022-09-30 MED ORDER — PROPOFOL 500 MG/50ML IV EMUL
INTRAVENOUS | Status: DC | PRN
  Administered 2022-09-30: 50 ug/kg/min via INTRAVENOUS
  Administered 2022-09-30: 30 mg via INTRAVENOUS
  Administered 2022-09-30: 20 ug via INTRAVENOUS

## 2022-09-30 MED ORDER — DEXMEDETOMIDINE HCL IN NACL 200 MCG/50ML IV SOLN
INTRAVENOUS | Status: DC | PRN
  Administered 2022-09-30: 4 ug via INTRAVENOUS
  Administered 2022-09-30: 8 ug via INTRAVENOUS

## 2022-09-30 MED ORDER — FENTANYL CITRATE (PF) 100 MCG/2ML IJ SOLN
INTRAMUSCULAR | Status: DC | PRN
  Administered 2022-09-30: 25 ug via INTRAVENOUS
  Administered 2022-09-30: 50 ug via INTRAVENOUS
  Administered 2022-09-30: 25 ug via INTRAVENOUS

## 2022-09-30 MED ORDER — HYALURONIDASE HUMAN 150 UNIT/ML IJ SOLN
INTRAMUSCULAR | Status: AC
  Filled 2022-09-30: qty 1

## 2022-09-30 MED ORDER — FENTANYL CITRATE (PF) 100 MCG/2ML IJ SOLN
INTRAMUSCULAR | Status: AC
  Filled 2022-09-30: qty 2

## 2022-09-30 SURGICAL SUPPLY — 34 items
APPLICATOR COTTON TIP WD 3 STR (MISCELLANEOUS) ×1 IMPLANT
BLADE SURG 15 STRL LF DISP TIS (BLADE) ×1 IMPLANT
BLADE SURG 15 STRL SS (BLADE) ×1
CORD BIP STRL DISP 12FT (MISCELLANEOUS) ×1 IMPLANT
GAUZE SPONGE 2X2 STRL 8-PLY (GAUZE/BANDAGES/DRESSINGS) ×10 IMPLANT
GAUZE SPONGE 4X4 12PLY STRL (GAUZE/BANDAGES/DRESSINGS) ×1 IMPLANT
GLOVE SURG UNDER POLY LF SZ7 (GLOVE) ×2 IMPLANT
GOWN STRL REUS W/ TWL LRG LVL3 (GOWN DISPOSABLE) ×1 IMPLANT
GOWN STRL REUS W/TWL LRG LVL3 (GOWN DISPOSABLE) ×1
MARKER SKIN XFINE TIP W/RULER (MISCELLANEOUS) ×1 IMPLANT
NDL FILTER BLUNT 18X1 1/2 (NEEDLE) ×1 IMPLANT
NDL HYPO 30X.5 LL (NEEDLE) ×2 IMPLANT
NEEDLE FILTER BLUNT 18X1 1/2 (NEEDLE) ×1 IMPLANT
NEEDLE HYPO 30X.5 LL (NEEDLE) ×2 IMPLANT
PACK ENT CUSTOM (PACKS) ×1 IMPLANT
SOL PREP PVP 2OZ (MISCELLANEOUS) ×1
SOLUTION PREP PVP 2OZ (MISCELLANEOUS) ×1 IMPLANT
SUT CHROMIC 4-0 (SUTURE)
SUT CHROMIC 4-0 M2 12X2 ARM (SUTURE)
SUT CHROMIC 5 0 P 3 (SUTURE) IMPLANT
SUT ETHILON 4 0 CL P 3 (SUTURE) IMPLANT
SUT GUT PLAIN 6-0 1X18 ABS (SUTURE) ×1 IMPLANT
SUT MERSILENE 4-0 S-2 (SUTURE) IMPLANT
SUT PROLENE 5 0 P 3 (SUTURE) IMPLANT
SUT PROLENE 6 0 P 1 18 (SUTURE) IMPLANT
SUT SILK 4 0 G 3 (SUTURE) IMPLANT
SUT VIC AB 5-0 P-3 18X BRD (SUTURE) IMPLANT
SUT VIC AB 5-0 P3 18 (SUTURE)
SUT VICRYL 6-0 S14 CTD (SUTURE) IMPLANT
SUT VICRYL 7 0 TG140 8 (SUTURE) IMPLANT
SUTURE CHRMC 4-0 M2 12X2 ARM (SUTURE) IMPLANT
SYR 10ML LL (SYRINGE) ×1 IMPLANT
SYR 3ML LL SCALE MARK (SYRINGE) ×1 IMPLANT
WATER STERILE IRR 250ML POUR (IV SOLUTION) ×1 IMPLANT

## 2022-09-30 NOTE — Transfer of Care (Signed)
Immediate Anesthesia Transfer of Care Note  Patient: Kathryn Hart  Procedure(s) Performed: BLEPHAROPLASTY UPPER EYELID; W/EXCESS SKIN BILATERAL (Bilateral)  Patient Location: PACU  Anesthesia Type: General  Level of Consciousness: awake, alert  and patient cooperative  Airway and Oxygen Therapy: Patient Spontanous Breathing and Patient connected to supplemental oxygen  Post-op Assessment: Post-op Vital signs reviewed, Patient's Cardiovascular Status Stable, Respiratory Function Stable, Patent Airway and No signs of Nausea or vomiting  Post-op Vital Signs: Reviewed and stable  Complications: No notable events documented.

## 2022-09-30 NOTE — Op Note (Signed)
Preoperative Diagnosis:  Visually significant dermatochalasis bilateral  Upper Eyelid(s)  Postoperative Diagnosis:  Same.  Procedure(s) Performed:   Upper eyelid blepharoplasty with excess skin excision  bilateral  Upper Eyelid(s)  Surgeon: Hubbard Robinson. Ether Griffins, M.D.  Assistants: none  Anesthesia: MAC  Specimens: None.  Estimated Blood Loss: Minimal.  Complications: None.  Operative Findings: None Dictated  Procedure:   Allergies were reviewed and the patient is allergic to Patient has no known allergies..   After the risks, benefits, complications and alternatives were discussed with the patient, appropriate informed consent was obtained and the patient was brought to the operating suite. The patient was reclined supine and a timeout was conducted.  The patient was then sedated.  Local anesthetic consisting of a 50-50 mixture of 2% lidocaine with epinephrine and 0.75% bupivacaine with added Hylenex was injected subcutaneously to both  upper eyelid(s). After adequate local was instilled, the patient was prepped and draped in the usual sterile fashion for eyelid surgery.   Attention was turned to the upper eyelids. A 8mm upper eyelid crease incision line was marked with calipers on both  upper eyelid(s).  A pinch test was used to estimate the amount of excess skin to remove and this was marked in standard blepharoplasty style fashion. Attention was turned to the  right  upper eyelid. A #15 blade was used to open the premarked incision line. A Skin only flap was excised and hemostasis was obtained with bipolar cautery. A buttonhole was created medially in orbicularis and orbital septum to reveal the medial fat pocket. This was dissected free from fascial attachments, cauterized towards the pedicle base and excised to produce a nice flattening of the medial corner of the upper eyelid.  A strip of ROOF was excised to help debulk the lateral upper eyelid.  Attention was then turned to the  opposite eyelid where the same procedure was performed in the same manner. Hemostasis was obtained with bipolar cautery throughout. All incisions were then closed with a combination of running and interrupted 6-0 fast absorbing plain suture. The patient tolerated the procedure well.  Erythromycin ophthalmic ointment was applied to her incision sites, followed by ice packs. She was taken to the recovery area where she recovered without difficulty.  Post-Op Plan/Instructions:  The patient was instructed to use ice packs frequently for the next 48 hours. She was instructed to use Erythromycin ophthalmic ointment on her incisions 4 times a day for the next 12 to 14 days. She was given a prescription for tramadol (or similar) for pain control should Tylenol not be effective. She was asked to to follow up in 2-3 weeks' time at the [facility] in [place], [place] or sooner as needed for problems.  Kathryn Hart M. Ether Griffins, M.D. Ophthalmology

## 2022-09-30 NOTE — Interval H&P Note (Signed)
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  The various methods of treatment have been discussed with the patient and family. After consideration of risks, benefits and other options for treatment, the patient has consented to  Procedure(s): BLEPHAROPLASTY UPPER EYELID; W/EXCESS SKIN BILATERAL (Bilateral) as a surgical intervention.  The patient's history has been reviewed, patient examined, no change in status, stable for surgery.  I have reviewed the patient's chart and labs.  Questions were answered to the patient's satisfaction.     Ether Griffins, Kathryn Hart M

## 2022-09-30 NOTE — Anesthesia Postprocedure Evaluation (Signed)
Anesthesia Post Note  Patient: Basilia Jumbo  Procedure(s) Performed: BLEPHAROPLASTY UPPER EYELID; W/EXCESS SKIN BILATERAL (Bilateral)  Patient location during evaluation: PACU Anesthesia Type: General Level of consciousness: awake and alert Pain management: pain level controlled Vital Signs Assessment: post-procedure vital signs reviewed and stable Respiratory status: spontaneous breathing, nonlabored ventilation, respiratory function stable and patient connected to nasal cannula oxygen Cardiovascular status: stable and blood pressure returned to baseline Postop Assessment: no apparent nausea or vomiting Anesthetic complications: no   No notable events documented.   Last Vitals:  Vitals:   09/30/22 0955  BP: 131/73  Pulse: 65  Resp: 18  Temp: 36.6 C  SpO2: 97%    Last Pain:  Vitals:   09/30/22 0955  TempSrc: Temporal  PainSc: 0-No pain                 Eulah Walkup C Shaletha Humble

## 2022-09-30 NOTE — H&P (Signed)
[facility]: [facility]  Primary Care Physician:  Rayetta Humphrey, MD Ophthalmologist: Dr. Hubbard Robinson. Ether Griffins, M.D.  Pre-Procedure History & Physical: HPI:  Kathryn Hart is a 77 y.o. female here for periocular surgery.   Past Medical History:  Diagnosis Date   Cancer (HCC) 05/2014   left sided breast cancer   COPD (chronic obstructive pulmonary disease) (HCC)    mild   Headache    Personal history of radiation therapy 2016   No Chemo   Wears partial dentures    top    Past Surgical History:  Procedure Laterality Date   ABDOMINAL HYSTERECTOMY     BREAST LUMPECTOMY Left 05/2014   CATARACT EXTRACTION W/PHACO Right 07/17/2019   Procedure: CATARACT EXTRACTION PHACO AND INTRAOCULAR LENS PLACEMENT (IOC) RIGHT PANOPTIX  TORIC LENS;  Surgeon: Lockie Mola, MD;  Location: [facility];  Service: Ophthalmology;  Laterality: Right;  10.72 1:20.2 13.3%   CATARACT EXTRACTION W/PHACO Left 09/18/2019   Procedure: CATARACT EXTRACTION PHACO AND INTRAOCULAR LENS PLACEMENT (IOC) LEFT PANOPTIX TORIC LENS 13.51  01:21.1  16.7%;  Surgeon: Lockie Mola, MD;  Location: [facility];  Service: Ophthalmology;  Laterality: Left;    Prior to Admission medications   Medication Sig Start Date End Date Taking? Authorizing Provider  alendronate (FOSAMAX) 70 MG tablet Take 70 mg by mouth once a week. Take with a full glass of water on an empty stomach.   Yes [provider]  buPROPion (WELLBUTRIN SR) 150 MG 12 hr tablet Take 150 mg by mouth daily.   Yes [provider]  cholecalciferol (VITAMIN D3) 25 MCG (1000 UNIT) tablet Take 2,000 Units by mouth daily.   Yes [provider]  traZODone (DESYREL) 100 MG tablet Take 100 mg by mouth at bedtime.   Yes [provider]    Allergies as of 06/20/2022   (No Known Allergies)    History reviewed. No pertinent family history.  Social History   Socioeconomic History   Marital  status: Widowed    Spouse name: Not on file   Number of children: Not on file   Years of education: Not on file   Highest education level: Not on file  Occupational History   Not on file  Tobacco Use   Smoking status: Former    Current packs/day: 0.00    Average packs/day: 0.5 packs/day for 20.0 years (10.0 ttl pk-yrs)    Types: Cigarettes    Start date: 02/10/1998    Quit date: 02/10/2018    Years since quitting: 4.6   Smokeless tobacco: Never  Vaping Use   Vaping status: Never Used  Substance and Sexual Activity   Alcohol use: Yes    Alcohol/week: 2.0 standard drinks of alcohol    Types: 2 Glasses of wine per week    Comment:     Drug use: Not on file   Sexual activity: Not on file  Other Topics Concern   Not on file  Social History Narrative   Not on file   Social Determinants of Health   Financial Resource Strain: Low Risk  (09/05/2022)   Received from [facility]   Overall Financial Resource Strain (CARDIA)    Difficulty of Paying Living Expenses: Not hard at all  Food Insecurity: No Food Insecurity (09/05/2022)   Received from [facility]   Hunger Vital Sign    Worried About Running Out of Food in the Last Year: Never true    Ran  Out of Food in the Last Year: Never true  Transportation Needs: No Transportation Needs (09/05/2022)   Received from [facility] - Transportation    In the past 12 months, has lack of transportation kept you from medical appointments or from getting medications?: No    Lack of Transportation (Non-Medical): No  Physical Activity: Not on file  Stress: Not on file  Social Connections: Not on file  Intimate Partner Violence: Not on file    Review of Systems: See HPI, otherwise negative ROS  Physical Exam: BP 131/73   Pulse 65   Temp 97.8 F (36.6 C) (Temporal)   Resp 18   Ht 5\' 2"  (1.575 m)   Wt 49 kg   SpO2 97%   BMI 19.75 kg/m  General:   Alert and cooperative in  NAD Head:  Normocephalic and atraumatic. Respiratory:  Normal work of breathing.  Impression/Plan: Kathryn Hart is here for periocular surgery.  Risks, benefits, limitations, and alternatives regarding surgery have been reviewed with the patient.  Questions have been answered.  All parties agreeable.   Imagene Riches, MD  09/30/2022, 11:44 AM

## 2022-10-03 ENCOUNTER — Encounter: Payer: Self-pay | Admitting: Ophthalmology

## 2023-04-21 ENCOUNTER — Other Ambulatory Visit: Payer: Self-pay

## 2023-04-21 ENCOUNTER — Inpatient Hospital Stay
Admission: EM | Admit: 2023-04-21 | Discharge: 2023-04-26 | DRG: 200 | Disposition: A | Discharge status: Home Health | Attending: Internal Medicine | Admitting: Internal Medicine

## 2023-04-21 ENCOUNTER — Emergency Department

## 2023-04-21 DIAGNOSIS — S2241XA Multiple fractures of ribs, right side, initial encounter for closed fracture: Secondary | ICD-10-CM | POA: Diagnosis present

## 2023-04-21 DIAGNOSIS — D72829 Elevated white blood cell count, unspecified: Secondary | ICD-10-CM | POA: Diagnosis present

## 2023-04-21 DIAGNOSIS — K219 Gastro-esophageal reflux disease without esophagitis: Secondary | ICD-10-CM | POA: Diagnosis present

## 2023-04-21 DIAGNOSIS — S270XXA Traumatic pneumothorax, initial encounter: Secondary | ICD-10-CM | POA: Diagnosis present

## 2023-04-21 DIAGNOSIS — S32040K Wedge compression fracture of fourth lumbar vertebra, subsequent encounter for fracture with nonunion: Secondary | ICD-10-CM | POA: Diagnosis present

## 2023-04-21 DIAGNOSIS — M8580 Other specified disorders of bone density and structure, unspecified site: Secondary | ICD-10-CM | POA: Diagnosis present

## 2023-04-21 DIAGNOSIS — G4700 Insomnia, unspecified: Secondary | ICD-10-CM | POA: Diagnosis not present

## 2023-04-21 DIAGNOSIS — E871 Hypo-osmolality and hyponatremia: Secondary | ICD-10-CM | POA: Diagnosis present

## 2023-04-21 DIAGNOSIS — S12041A Nondisplaced lateral mass fracture of first cervical vertebra, initial encounter for closed fracture: Secondary | ICD-10-CM | POA: Diagnosis present

## 2023-04-21 DIAGNOSIS — Z853 Personal history of malignant neoplasm of breast: Secondary | ICD-10-CM | POA: Diagnosis not present

## 2023-04-21 DIAGNOSIS — Z87891 Personal history of nicotine dependence: Secondary | ICD-10-CM

## 2023-04-21 DIAGNOSIS — Z8781 Personal history of (healed) traumatic fracture: Secondary | ICD-10-CM

## 2023-04-21 DIAGNOSIS — W109XXA Fall (on) (from) unspecified stairs and steps, initial encounter: Secondary | ICD-10-CM | POA: Diagnosis present

## 2023-04-21 DIAGNOSIS — J939 Pneumothorax, unspecified: Secondary | ICD-10-CM | POA: Diagnosis present

## 2023-04-21 DIAGNOSIS — R636 Underweight: Secondary | ICD-10-CM | POA: Diagnosis present

## 2023-04-21 DIAGNOSIS — W108XXA Fall (on) (from) other stairs and steps, initial encounter: Secondary | ICD-10-CM

## 2023-04-21 DIAGNOSIS — Z681 Body mass index (BMI) 19 or less, adult: Secondary | ICD-10-CM

## 2023-04-21 DIAGNOSIS — E222 Syndrome of inappropriate secretion of antidiuretic hormone: Secondary | ICD-10-CM | POA: Diagnosis present

## 2023-04-21 DIAGNOSIS — J449 Chronic obstructive pulmonary disease, unspecified: Secondary | ICD-10-CM | POA: Diagnosis present

## 2023-04-21 DIAGNOSIS — Z923 Personal history of irradiation: Secondary | ICD-10-CM | POA: Diagnosis not present

## 2023-04-21 DIAGNOSIS — Z79899 Other long term (current) drug therapy: Secondary | ICD-10-CM | POA: Diagnosis not present

## 2023-04-21 DIAGNOSIS — S12000A Unspecified displaced fracture of first cervical vertebra, initial encounter for closed fracture: Secondary | ICD-10-CM

## 2023-04-21 DIAGNOSIS — W19XXXA Unspecified fall, initial encounter: Principal | ICD-10-CM

## 2023-04-21 DIAGNOSIS — S2249XA Multiple fractures of ribs, unspecified side, initial encounter for closed fracture: Secondary | ICD-10-CM

## 2023-04-21 LAB — CBC WITH DIFFERENTIAL/PLATELET
Abs Immature Granulocytes: 0.12 10*3/uL — ABNORMAL HIGH (ref 0.00–0.07)
Basophils Absolute: 0 10*3/uL (ref 0.0–0.1)
Basophils Relative: 0 %
Eosinophils Absolute: 0 10*3/uL (ref 0.0–0.5)
Eosinophils Relative: 0 %
HCT: 36.2 % (ref 36.0–46.0)
Hemoglobin: 12.4 g/dL (ref 12.0–15.0)
Immature Granulocytes: 1 %
Lymphocytes Relative: 5 %
Lymphs Abs: 0.8 10*3/uL (ref 0.7–4.0)
MCH: 30.9 pg (ref 26.0–34.0)
MCHC: 34.3 g/dL (ref 30.0–36.0)
MCV: 90.3 fL (ref 80.0–100.0)
Monocytes Absolute: 0.1 10*3/uL (ref 0.1–1.0)
Monocytes Relative: 1 %
Neutro Abs: 15.1 10*3/uL — ABNORMAL HIGH (ref 1.7–7.7)
Neutrophils Relative %: 93 %
Platelets: 389 10*3/uL (ref 150–400)
RBC: 4.01 MIL/uL (ref 3.87–5.11)
RDW: 13.2 % (ref 11.5–15.5)
WBC: 16.1 10*3/uL — ABNORMAL HIGH (ref 4.0–10.5)
nRBC: 0 % (ref 0.0–0.2)

## 2023-04-21 LAB — BASIC METABOLIC PANEL WITH GFR
Anion gap: 7 (ref 5–15)
BUN: 8 mg/dL (ref 8–23)
CO2: 28 mmol/L (ref 22–32)
Calcium: 8.8 mg/dL — ABNORMAL LOW (ref 8.9–10.3)
Chloride: 99 mmol/L (ref 98–111)
Creatinine, Ser: 0.75 mg/dL (ref 0.44–1.00)
GFR, Estimated: >60 mL/min (ref >60)
Glucose, Bld: 164 mg/dL — ABNORMAL HIGH (ref 70–99)
Potassium: 4.3 mmol/L (ref 3.5–5.1)
Sodium: 134 mmol/L — ABNORMAL LOW (ref 135–145)

## 2023-04-21 MED ORDER — MORPHINE SULFATE (PF) 2 MG/ML IV SOLN
2.0000 mg | Freq: Once | INTRAVENOUS | Status: AC
  Administered 2023-04-21: 2 mg via INTRAVENOUS
  Filled 2023-04-21: qty 1

## 2023-04-21 MED ORDER — IOHEXOL 350 MG/ML SOLN
100.0000 mL | Freq: Once | INTRAVENOUS | Status: AC | PRN
  Administered 2023-04-21: 100 mL via INTRAVENOUS

## 2023-04-21 MED ORDER — HYDROCODONE-ACETAMINOPHEN 5-325 MG PO TABS
1.0000 | ORAL_TABLET | ORAL | Status: DC | PRN
  Administered 2023-04-21 – 2023-04-22 (×4): 1 via ORAL
  Filled 2023-04-21 (×4): qty 1

## 2023-04-21 MED ORDER — DOCUSATE SODIUM 100 MG PO CAPS
100.0000 mg | ORAL_CAPSULE | Freq: Two times a day (BID) | ORAL | Status: DC | PRN
  Administered 2023-04-25: 100 mg via ORAL
  Filled 2023-04-21: qty 1

## 2023-04-21 MED ORDER — ONDANSETRON HCL 4 MG/2ML IJ SOLN
4.0000 mg | Freq: Four times a day (QID) | INTRAMUSCULAR | Status: DC | PRN
  Administered 2023-04-22 (×3): 4 mg via INTRAVENOUS
  Filled 2023-04-21 (×3): qty 2

## 2023-04-21 MED ORDER — FENTANYL CITRATE PF 50 MCG/ML IJ SOSY
50.0000 ug | PREFILLED_SYRINGE | Freq: Once | INTRAMUSCULAR | Status: AC
  Administered 2023-04-21: 50 ug via INTRAVENOUS
  Filled 2023-04-21: qty 1

## 2023-04-21 MED ORDER — ACETAMINOPHEN 325 MG PO TABS
650.0000 mg | ORAL_TABLET | ORAL | Status: DC | PRN
  Administered 2023-04-23: 650 mg via ORAL
  Filled 2023-04-21: qty 2

## 2023-04-21 MED ORDER — POLYETHYLENE GLYCOL 3350 17 G PO PACK: 17.0000 g | PACK | Freq: Every day | ORAL | Status: DC | PRN

## 2023-04-21 MED ORDER — HYDROMORPHONE HCL 1 MG/ML IJ SOLN
0.5000 mg | INTRAMUSCULAR | Status: DC | PRN
  Administered 2023-04-22: 0.5 mg via INTRAVENOUS
  Filled 2023-04-21: qty 1

## 2023-04-21 MED ORDER — ENOXAPARIN SODIUM 40 MG/0.4ML IJ SOSY
40.0000 mg | PREFILLED_SYRINGE | INTRAMUSCULAR | Status: DC
  Administered 2023-04-21 – 2023-04-25 (×5): 40 mg via SUBCUTANEOUS
  Filled 2023-04-21 (×5): qty 0.4

## 2023-04-21 NOTE — Progress Notes (Signed)
 Initial review  Nondisplaced C1 lateral mass fracture.  Requesting CTA to evaluate for vascular dissection.  Maintain in the cervical collar at this time.  Will give full formal consultation on rounds in the morning.

## 2023-04-21 NOTE — ED Triage Notes (Signed)
 Pt comes in via POV after having a fall down some stairs today. Pt states that she slipped and fell down from the top of the stairs in a home. Pt is complaining of back, and head pain.  Pt with swelling to the back of her head and swelling to the right side of her back. Pt alert and oriented x4, and visibly in pain.

## 2023-04-21 NOTE — ED Provider Notes (Signed)
 [facility] Provider Note    Event Date/Time   First MD Initiated Contact with Patient 04/21/23 1503     (approximate)   History   Fall   HPI  Kathryn Hart is a 78 y.o. female  who presents to the emergency department today because of concern for neck and back pain after a fall. The patient was playing with her great granddaughter when she fell down stairs. She is unsure if she lost consciousness. Was able to get up and stand. Had significant pain in her upper back and when trying to turn her head. Denies any significant extremity pain.     Physical Exam   Triage Vital Signs: ED Triage Vitals  Encounter Vitals Group     BP --      Systolic BP Percentile --      Diastolic BP Percentile --      Pulse --      Resp --      Temp --      Temp src --      SpO2 --      Weight 04/21/23 1336 101 lb (45.8 kg)     Height 04/21/23 1336 5\' 2"  (1.575 m)     Head Circumference --      Peak Flow --      Pain Score 04/21/23 1335 10     Pain Loc --      Pain Education --      Exclude from Growth Chart --     Most recent vital signs: There were no vitals filed for this visit.  General: Awake, alert, oriented. CV:  Good peripheral perfusion. Regular rate and rhythm. Resp:  Slightly increased work of breathing.  Abd:  No distention. Non tender. Other:  C-collar in place.    ED Results / Procedures / Treatments   Labs (all labs ordered are listed, but only abnormal results are displayed) Labs Reviewed  CBC WITH DIFFERENTIAL/PLATELET - Abnormal; Notable for the following components:      Result Value   WBC 16.1 (*)    Neutro Abs 15.1 (*)    Abs Immature Granulocytes 0.12 (*)    All other components within normal limits  BASIC METABOLIC PANEL WITH GFR - Abnormal; Notable for the following components:   Sodium 134 (*)    Glucose, Bld 164 (*)    Calcium 8.8 (*)    All other components within normal limits     EKG  I, Phineas Semen,  attending physician, personally viewed and interpreted this EKG  EKG Time: 1540 Rate: 78 Rhythm: sinus rhythm Axis: normal Intervals: qtc 428 QRS: narrow ST changes: no st elevation Impression: normal ekg   RADIOLOGY I independently interpreted and visualized the lumbar spine. My interpretation: No acute fracture Radiology interpretation:  IMPRESSION:  1. No acute osseous abnormality of the lumbar spine.  2. Mild-to-moderate multilevel degenerative changes.   I independently interpreted and visualized the CT head/cervical spine. My interpretation: No ICH. C1 fracture. Pneumothorax.  Radiology interpretation:  IMPRESSION:  1. No acute intracranial abnormality.  2. There is comminuted and minimally displaced fracture of the left  lateral mass with extension up to the superior and inferior  articular facets. The fracture line also traverses across the left  transverse foramina. Further evaluation with neck CTA is recommended  to evaluate for vascular injury.  3. There is fracture of the posteromedial aspect of the right first  rib. There is associated at least small  right pneumothorax,  partially seen. There is also mildly displaced fracture of left  posterior second rib.       PROCEDURES:  Critical Care performed: No    MEDICATIONS ORDERED IN ED: Medications  fentaNYL (SUBLIMAZE) injection 50 mcg (has no administration in time range)     IMPRESSION / MDM / ASSESSMENT AND PLAN / ED COURSE  I reviewed the triage vital signs and the nursing notes.                              Differential diagnosis includes, but is not limited to, fracture, contusion, dislocation, ICH  Patient's presentation is most consistent with acute presentation with potential threat to life or bodily function.   The patient is on the cardiac monitor to evaluate for evidence of arrhythmia and/or significant heart rate changes.  Patient presented to the emergency department today because of  primary concerns for neck pain and back pain after a fall.  CT head and cervical spine ordered from triage concerning for C1 fracture as well as pneumothorax.  Patient had been placed in [place] J collar prior to my evaluation.  Dr. Katrinka Blazing with neurosurgery was consulted and did not feel patient required any emergent intervention.  Did feel [place] J collar at this time was appropriate.  CTA was ordered to evaluate for vascular injury, this was negative.  In terms of the pneumothorax a CT chest abdomen and pelvis was ordered to further evaluate to evaluate for other occult injuries.  Did discuss with Dr. Deforest Hoyles with pulmonology who did not think patient would benefit from pigtail catheter.  I did discuss with the patient and consented patient for the procedure.  Seldinger technique was attempted.  While I did have a good air return on initial needle entry I think the guidewire was placed subcutaneously since no advancement of the dilator was possible.  I then tried to reestablish entry into the pleural space however got no further return of air.  After a few attempts the patient requested I stop the procedure.  Post attempt chest x-ray does show small pneumothorax.  At this time given that patient's vital signs show no indication for tension I will stop further attempts.  Did discuss with ICU team however given concern for possible worsening who will evaluate patient for admission.      FINAL CLINICAL IMPRESSION(S) / ED DIAGNOSES   Final diagnoses:  Fall, initial encounter  Closed displaced fracture of first cervical vertebra, unspecified fracture morphology, initial encounter (HCC)  Closed fracture of multiple ribs of right side, initial encounter  Pneumothorax, unspecified type      Note:  This document was prepared using Dragon voice recognition software and may include unintentional dictation errors.    Phineas Semen, MD 04/21/23 [phone]

## 2023-04-21 NOTE — H&P (Signed)
 NAME:  Kathryn Hart, MRN:  [id number], DOB:  [birth year], LOS: 1 ADMISSION DATE:  04/21/2023 CONSULTATION DATE: 04/21/2023 REFERRING MD: Dr. Hendrick Locke, ED attending, CHIEF COMPLAINT: Traumatic fall with right upper lung pneumothorax  BRIEF SYNOPSIS: 78 y.o female admitted following a traumatic fall; found to have a right upper lung pneumothorax.  History of Present Illness:  This is a pleasant 78 year old female who was in her normal state of health babysitting her great grandbaby when she missed her step and fell down the stairs with the baby.  She sustained a traumatic C1 fracture as well as multiple rib fractures.  ED workup revealed a right upper lung pneumothorax.  The ED attending attempted to place a pigtail but the procedure was aborted due to inability to thread the guidewire.  Repeat chest x-ray showed improvement.  Patient remained hemodynamically stable and did not require any respiratory support besides supplemental oxygen.  PCCM was consulted for management of pneumothorax.  Pertinent  Medical History   Past Medical History:  Diagnosis Date   Cancer (HCC) 05/2014   left sided breast cancer   COPD (chronic obstructive pulmonary disease) (HCC)    mild   Headache    Personal history of radiation therapy 2016   No Chemo   Wears partial dentures    top    Past Surgical History:  Procedure Laterality Date   ABDOMINAL HYSTERECTOMY     BREAST LUMPECTOMY Left 05/2014   BROW LIFT Bilateral 09/30/2022   Procedure: BLEPHAROPLASTY UPPER EYELID; W/EXCESS SKIN BILATERAL;  Surgeon: Zacarias Hermann, MD;  Location: [facility];  Service: Ophthalmology;  Laterality: Bilateral;   CATARACT EXTRACTION W/PHACO Right 07/17/2019   Procedure: CATARACT EXTRACTION PHACO AND INTRAOCULAR LENS PLACEMENT (IOC) RIGHT PANOPTIX  TORIC LENS;  Surgeon: Annell Kidney, MD;  Location: [facility];  Service: Ophthalmology;  Laterality: Right;  10.72 1:20.2 13.3%   CATARACT  EXTRACTION W/PHACO Left 09/18/2019   Procedure: CATARACT EXTRACTION PHACO AND INTRAOCULAR LENS PLACEMENT (IOC) LEFT PANOPTIX TORIC LENS 13.51  01:21.1  16.7%;  Surgeon: Annell Kidney, MD;  Location: [facility];  Service: Ophthalmology;  Laterality: Left;    Social History   Socioeconomic History   Marital status: Widowed    Spouse name: Not on file   Number of children: Not on file   Years of education: Not on file   Highest education level: Not on file  Occupational History   Not on file  Tobacco Use   Smoking status: Former    Current packs/day: 0.00    Average packs/day: 0.5 packs/day for 20.0 years (10.0 ttl pk-yrs)    Types: Cigarettes    Start date: 02/10/1998    Quit date: 02/10/2018    Years since quitting: 5.1   Smokeless tobacco: Never  Vaping Use   Vaping status: Never Used  Substance and Sexual Activity   Alcohol use: Yes    Alcohol/week: 2.0 standard drinks of alcohol    Types: 2 Glasses of wine per week    Comment:     Drug use: Not on file   Sexual activity: Not on file  Other Topics Concern   Not on file  Social History Narrative   Not on file   Social Drivers of Health   Financial Resource Strain: Low Risk  (09/05/2022)   Received from [facility]   Overall Financial Resource Strain (CARDIA)    Difficulty of Paying Living Expenses: Not hard at all  Food Insecurity: No Food Insecurity (  04/22/2023)   Hunger Vital Sign    Worried About Running Out of Food in the Last Year: Never true    Ran Out of Food in the Last Year: Never true  Transportation Needs: No Transportation Needs (04/22/2023)   PRAPARE - Administrator, Civil Service (Medical): No    Lack of Transportation (Non-Medical): No  Physical Activity: Not on file  Stress: Not on file  Social Connections: Unknown (04/22/2023)   Social Connection and Isolation Panel [NHANES]    Frequency of Communication with Friends and Family: More than three times a week     Frequency of Social Gatherings with Friends and Family: More than three times a week    Attends Religious Services: Patient declined    Database administrator or Organizations: Patient declined    Attends Banker Meetings: Patient declined    Marital Status: Patient declined  Intimate Partner Violence: Not At Risk (04/22/2023)   Humiliation, Afraid, Rape, and Kick questionnaire    Fear of Current or Ex-Partner: No    Emotionally Abused: No    Physically Abused: No    Sexually Abused: No    Significant Hospital Events: Including procedures, antibiotic start and stop dates in addition to other pertinent events   04/21/2023: Admitted  following  Micro Data:  None  Antimicrobials:  None  Interim History / Subjective:  As above Objective   Blood pressure (!) 140/71, pulse 64, temperature 97.6 F (36.4 C), temperature source Oral, resp. rate 10, height 5\' 2"  (1.575 m), weight 45.8 kg, SpO2 100%.        Intake/Output Summary (Last 24 hours) at 04/22/2023 0846 Last data filed at 04/22/2023 0700 Gross per 24 hour  Intake --  Output 125 ml  Net -125 ml   Filed Weights   04/21/23 1336 04/22/23 0346  Weight: 45.8 kg 45.8 kg   REVIEW OF SYSTEMS Constitutional: Negative for fever and chills but positive for generalized malaise  HENT: Negative for congestion and rhinorrhea but states that she just recovered from an upper respiratory tract infection which has left her very weak.   Eyes: Negative for redness and visual disturbance.  Respiratory: Negative for shortness of breath and wheezing but reports pain with deep breaths.   Cardiovascular: Negative for chest pain and palpitations.  Gastrointestinal: Negative  for nausea , vomiting and abdominal pain and  Loose stools Genitourinary: Negative for dysuria and urgency.  Endocrine: Denies polyuria, polyphagia and heat intolerance Musculoskeletal: positive for neck pain and back pain.  Skin: Negative for pallor and wound.   Neurological: Negative for dizziness and headaches    PHYSICAL EXAMINATION: Constitutional: She is oriented to person, place, and time. Appears well nourished, mild to moderate distress.  HENT: Normocephalic and atraumatic,  EOM are normal,  Pupils are equal, round, and reactive to light, normal range of motion and neck is supple; trachea in midline Cardiovascular: Normal rate and regular rhythm, no murmur, gallop and no friction rub.    Pulmonary/Chest: Effort normal. She has no wheezes. She has no rales.  Abdominal: Soft, non-distended, epigastric tenderness on palpation. No no guarding, rebound or referred rebound tenderness.  Musculoskeletal: She exhibits no edema but pain with ROM of neck and leaning forward.  Neurological: She is alert and oriented to person, place, and time.  Skin: Skin is warm and dry. She is not diaphoretic.  Psychiatric: She has a normal mood and affect. Her behavior is normal.    Labs/imaging that I havepersonally  reviewed  Lab Orders         MRSA Next Gen by PCR, Nasal         CBC with Differential         Basic metabolic panel         Basic metabolic panel         Glucose, capillary       Intake/Output Summary (Last 24 hours) at 04/22/2023 0846 Last data filed at 04/22/2023 0700 Gross per 24 hour  Intake --  Output 125 ml  Net -125 ml   Resolved Hospital Problem list   none ASSESSMENT AND PLAN  Traumatic Fall, initial encounter -Maintain on fall precautions - Follow-up with Ortho and neurosurgery as scheduled - Bedrest for now until PT OT evaluation  Closed displaced fracture of first cervical vertebra, unspecified fracture morphology, initial encounter [facility]) -Patient has a C1 cervical fracture. - She has been seen by neurosurgery - Pain management with Norco, Tylenol and Dilaudid - Maintain cervical collar in place  Closed fracture of multiple ribs of right side, initial encounter -Pain management as outlined above - Antitussives to prevent  excessive coughing - Incentive spirometry - Patient educated on deep breathing and splinting techniques during deep breaths and coughing - Patient advised to request pain medications as scheduled.  Pneumothorax, unspecified type: Patient has a right apical pneumothorax; stable otherwise. - Maintain on nonrebreather mask and transition to nasal cannula as tolerated - Pain management as above - Monitor respiratory status closely - Chest x-ray in the morning and if no improvement consider obtaining a CT chest to rule out extension of the pneumothorax - Consider a pigtail chest tube if no improvement on chest x-ray or if patient's respiratory status deteriorates - Incentive spirometry every hour while awake; patient instructed  Gastroesophageal reflux with associated anorexia - Will start on Pepcid 20 mg twice a day - Protein supplements such as Ensure with meals 3 times daily - Patient encouraged to increase fluids even if she is not tolerating solid foods  Best practice (right click and "Reselect all SmartList Selections" daily)  Diet: Advance regular diet as tolerated Pain/Anxiety/Delirium protocol (if indicated): No VAP protocol (if indicated): Not indicated DVT prophylaxis: LMWH GI prophylaxis: PPI Glucose control:  SSI No Central venous access:  N/A Arterial line:  N/A Foley:  N/A Mobility:  bed rest  Code Status:  FULL Disposition:ICU  Labs   CBC: Recent Labs  Lab 04/21/23 1505  WBC 16.1*  NEUTROABS 15.1*  HGB 12.4  HCT 36.2  MCV 90.3  PLT 389    Basic Metabolic Panel: Recent Labs  Lab 04/21/23 1505 04/22/23 0311  NA 134* 131*  K 4.3 4.0  CL 99 96*  CO2 28 29  GLUCOSE 164* 135*  BUN 8 7*  CREATININE 0.75 0.57  CALCIUM 8.8* 8.5*   GFR: Estimated Creatinine Clearance: 41.2 mL/min (by C-G formula based on SCr of 0.57 mg/dL). Recent Labs  Lab 04/21/23 1505  WBC 16.1*    Liver Function Tests: No results for input(s): "AST", "ALT", "ALKPHOS",  "BILITOT", "PROT", "ALBUMIN" in the last 168 hours. No results for input(s): "LIPASE", "AMYLASE" in the last 168 hours. No results for input(s): "AMMONIA" in the last 168 hours.  ABG No results found for: "PHART", "PCO2ART", "PO2ART", "HCO3", "TCO2", "ACIDBASEDEF", "O2SAT"   Coagulation Profile: No results for input(s): "INR", "PROTIME" in the last 168 hours.  Cardiac Enzymes: No results for input(s): "CKTOTAL", "CKMB", "CKMBINDEX", "TROPONINI" in the last 168 hours.  HbA1C: Hgb  A1c MFr Bld  Date/Time Value Ref Range Status  09/30/2016 03:48 AM 5.1 4.8 - 5.6 % Final    Comment:    (NOTE) Pre diabetes:          5.7%-6.4% Diabetes:              >6.4% Glycemic control for   <7.0% adults with diabetes     CBG: Recent Labs  Lab 04/22/23 0128  GLUCAP 115*     Past Medical History:  She,  has a past medical history of Cancer (HCC) (05/2014), COPD (chronic obstructive pulmonary disease) (HCC), Headache, Personal history of radiation therapy (2016), and Wears partial dentures.   Surgical History:   Past Surgical History:  Procedure Laterality Date   ABDOMINAL HYSTERECTOMY     BREAST LUMPECTOMY Left 05/2014   BROW LIFT Bilateral 09/30/2022   Procedure: BLEPHAROPLASTY UPPER EYELID; W/EXCESS SKIN BILATERAL;  Surgeon: Zacarias Hermann, MD;  Location: [facility];  Service: Ophthalmology;  Laterality: Bilateral;   CATARACT EXTRACTION W/PHACO Right 07/17/2019   Procedure: CATARACT EXTRACTION PHACO AND INTRAOCULAR LENS PLACEMENT (IOC) RIGHT PANOPTIX  TORIC LENS;  Surgeon: Annell Kidney, MD;  Location: [facility];  Service: Ophthalmology;  Laterality: Right;  10.72 1:20.2 13.3%   CATARACT EXTRACTION W/PHACO Left 09/18/2019   Procedure: CATARACT EXTRACTION PHACO AND INTRAOCULAR LENS PLACEMENT (IOC) LEFT PANOPTIX TORIC LENS 13.51  01:21.1  16.7%;  Surgeon: Annell Kidney, MD;  Location: [facility];  Service: Ophthalmology;  Laterality: Left;      Social History:   reports that she quit smoking about 5 years ago. Her smoking use included cigarettes. She started smoking about 25 years ago. She has a 10 pack-year smoking history. She has never used smokeless tobacco. She reports current alcohol use of about 2.0 standard drinks of alcohol per week.   Family History:  Her family history is not on file.   Allergies No Known Allergies   Home Medications  Prior to Admission medications   Medication Sig Start Date End Date Taking? Authorizing Provider  buPROPion (ZYBAN) 150 MG 12 hr tablet Take 150 mg by mouth 2 (two) times daily. 03/22/23  Yes [provider]  tiZANidine (ZANAFLEX) 4 MG tablet Take 4 mg by mouth 3 (three) times daily as needed. 09/16/22 09/16/23 Yes [provider]  traZODone (DESYREL) 100 MG tablet Take 100 mg by mouth at bedtime.   Yes [provider]  alendronate (FOSAMAX) 70 MG tablet Take 70 mg by mouth once a week. Take with a full glass of water on an empty stomach. Patient not taking: Reported on 04/21/2023    [provider]  buPROPion (WELLBUTRIN SR) 150 MG 12 hr tablet Take 150 mg by mouth daily. Patient not taking: Reported on 04/21/2023    [provider]  cholecalciferol (VITAMIN D3) 25 MCG (1000 UNIT) tablet Take 2,000 Units by mouth daily. Patient not taking: Reported on 04/21/2023    [provider]  erythromycin ophthalmic ointment Apply to sutures 4 times a day for 10-12 days.  Discontinue if allergy develops and call our office Patient not taking: Reported on 04/21/2023 09/30/22   Zacarias Hermann, MD  traMADol (ULTRAM) 50 MG tablet Take 1 every 4-6 hours as needed for pain not controlled by Tylenol Patient not taking: Reported on 04/21/2023 09/30/22   Zacarias Hermann, MD       DVT/GI PRX  assessed I Assessed the need for Labs I Assessed the need for Foley I Assessed the  need for Central Venous Line Family Discussion when available I Assessed the need for  Mobilization I made an Assessment of medications to be adjusted accordingly Safety Risk assessment completed  CASE DISCUSSED IN MULTIDISCIPLINARY ROUNDS WITH ICU [facility] physician Dr. Leslee Rase  Critical Care Time devoted to patient care services described in this note is 60 minutes minutes.   Dyer Klug S. Tukov-Yual, ANP-BC Pulmonary and Critical Care Medicine Prefer epic messenger for cross cover needs or call ICU at [phone] If after hours, please call E-link.  NB: This document was prepared using Dragon voice recognition software and may include unintentional dictation errors.

## 2023-04-22 ENCOUNTER — Inpatient Hospital Stay

## 2023-04-22 ENCOUNTER — Encounter: Payer: Self-pay | Admitting: Adult Health

## 2023-04-22 DIAGNOSIS — J939 Pneumothorax, unspecified: Secondary | ICD-10-CM | POA: Diagnosis not present

## 2023-04-22 DIAGNOSIS — W108XXA Fall (on) (from) other stairs and steps, initial encounter: Secondary | ICD-10-CM | POA: Diagnosis not present

## 2023-04-22 DIAGNOSIS — Z853 Personal history of malignant neoplasm of breast: Secondary | ICD-10-CM

## 2023-04-22 DIAGNOSIS — S12000A Unspecified displaced fracture of first cervical vertebra, initial encounter for closed fracture: Secondary | ICD-10-CM | POA: Diagnosis not present

## 2023-04-22 DIAGNOSIS — R636 Underweight: Secondary | ICD-10-CM

## 2023-04-22 DIAGNOSIS — M8580 Other specified disorders of bone density and structure, unspecified site: Secondary | ICD-10-CM | POA: Insufficient documentation

## 2023-04-22 DIAGNOSIS — Z8781 Personal history of (healed) traumatic fracture: Secondary | ICD-10-CM

## 2023-04-22 DIAGNOSIS — S2249XA Multiple fractures of ribs, unspecified side, initial encounter for closed fracture: Secondary | ICD-10-CM

## 2023-04-22 LAB — BASIC METABOLIC PANEL WITH GFR
Anion gap: 6 (ref 5–15)
BUN: 7 mg/dL — ABNORMAL LOW (ref 8–23)
CO2: 29 mmol/L (ref 22–32)
Calcium: 8.5 mg/dL — ABNORMAL LOW (ref 8.9–10.3)
Chloride: 96 mmol/L — ABNORMAL LOW (ref 98–111)
Creatinine, Ser: 0.57 mg/dL (ref 0.44–1.00)
GFR, Estimated: >60 mL/min (ref >60)
Glucose, Bld: 135 mg/dL — ABNORMAL HIGH (ref 70–99)
Potassium: 4 mmol/L (ref 3.5–5.1)
Sodium: 131 mmol/L — ABNORMAL LOW (ref 135–145)

## 2023-04-22 LAB — MRSA NEXT GEN BY PCR, NASAL: MRSA by PCR Next Gen: NOT DETECTED

## 2023-04-22 LAB — GLUCOSE, CAPILLARY: Glucose-Capillary: 115 mg/dL — ABNORMAL HIGH (ref 70–99)

## 2023-04-22 MED ORDER — ORAL CARE MOUTH RINSE: 15.0000 mL | OROMUCOSAL | Status: DC | PRN

## 2023-04-22 MED ORDER — TIZANIDINE HCL 4 MG PO TABS
4.0000 mg | ORAL_TABLET | Freq: Three times a day (TID) | ORAL | Status: DC | PRN
  Administered 2023-04-23 – 2023-04-26 (×6): 4 mg via ORAL
  Filled 2023-04-22 (×10): qty 1

## 2023-04-22 MED ORDER — GUAIFENESIN-CODEINE 100-10 MG/5ML PO SOLN
10.0000 mL | ORAL | Status: AC | PRN
  Administered 2023-04-22: 10 mL via ORAL
  Filled 2023-04-22: qty 10

## 2023-04-22 MED ORDER — TRAZODONE HCL 50 MG PO TABS
100.0000 mg | ORAL_TABLET | Freq: Every day | ORAL | Status: DC
Start: 2023-04-22 — End: 2023-04-26
  Administered 2023-04-22 – 2023-04-25 (×4): 100 mg via ORAL
  Filled 2023-04-22 (×3): qty 2
  Filled 2023-04-22: qty 1

## 2023-04-22 MED ORDER — CHLORHEXIDINE GLUCONATE CLOTH 2 % EX PADS
6.0000 | MEDICATED_PAD | Freq: Every day | CUTANEOUS | Status: DC
  Administered 2023-04-22 – 2023-04-26 (×5): 6 via TOPICAL

## 2023-04-22 MED ORDER — LIDOCAINE HCL 1 % IJ SOLN
INTRAMUSCULAR | Status: AC
  Filled 2023-04-22: qty 10

## 2023-04-22 MED ORDER — BUPROPION HCL ER (SR) 150 MG PO TB12
150.0000 mg | ORAL_TABLET | Freq: Two times a day (BID) | ORAL | Status: DC
  Administered 2023-04-22 – 2023-04-26 (×6): 150 mg via ORAL
  Filled 2023-04-22 (×9): qty 1

## 2023-04-22 MED ORDER — PROMETHAZINE (PHENERGAN) 6.25MG IN NS 50ML IVPB
6.2500 mg | Freq: Once | INTRAVENOUS | Status: AC
  Administered 2023-04-22: 6.25 mg via INTRAVENOUS
  Filled 2023-04-22: qty 6.25

## 2023-04-22 MED ORDER — ALUM & MAG HYDROXIDE-SIMETH 200-200-20 MG/5ML PO SUSP
15.0000 mL | ORAL | Status: DC | PRN
  Administered 2023-04-22: 15 mL via ORAL
  Filled 2023-04-22: qty 30

## 2023-04-22 MED ORDER — MORPHINE SULFATE (PF) 2 MG/ML IV SOLN
1.0000 mg | Freq: Once | INTRAVENOUS | Status: AC
  Administered 2023-04-22: 1 mg via INTRAVENOUS

## 2023-04-22 MED ORDER — HYDROMORPHONE HCL 1 MG/ML IJ SOLN
1.0000 mg | INTRAMUSCULAR | Status: DC | PRN
  Administered 2023-04-22 – 2023-04-24 (×7): 1 mg via INTRAVENOUS
  Filled 2023-04-22 (×8): qty 1

## 2023-04-22 MED ORDER — OXYCODONE HCL 5 MG PO TABS
5.0000 mg | ORAL_TABLET | Freq: Once | ORAL | Status: AC
  Administered 2023-04-22: 5 mg via ORAL
  Filled 2023-04-22: qty 1

## 2023-04-22 MED ORDER — MORPHINE SULFATE (PF) 2 MG/ML IV SOLN
INTRAVENOUS | Status: AC
Start: 2023-04-22 — End: 2023-04-22
  Administered 2023-04-22: 1 mg via INTRAMUSCULAR
  Filled 2023-04-22: qty 1

## 2023-04-22 MED ORDER — FAMOTIDINE 20 MG PO TABS
20.0000 mg | ORAL_TABLET | Freq: Two times a day (BID) | ORAL | Status: DC
  Administered 2023-04-22 – 2023-04-26 (×9): 20 mg via ORAL
  Filled 2023-04-22 (×9): qty 1

## 2023-04-22 MED ORDER — OXYCODONE HCL 5 MG PO TABS
5.0000 mg | ORAL_TABLET | ORAL | Status: DC | PRN
  Administered 2023-04-22 – 2023-04-26 (×12): 5 mg via ORAL
  Filled 2023-04-22 (×13): qty 1

## 2023-04-22 MED ORDER — NALOXONE HCL 0.4 MG/ML IJ SOLN: 0.4000 mg | INTRAMUSCULAR | Status: DC | PRN

## 2023-04-22 NOTE — Plan of Care (Signed)
   Problem: Nutrition: Goal: Adequate nutrition will be maintained Outcome: Progressing   Problem: Coping: Goal: Level of anxiety will decrease Outcome: Progressing

## 2023-04-22 NOTE — Progress Notes (Signed)
 PHARMACY CONSULT NOTE - FOLLOW UP  Pharmacy Consult for Electrolyte Monitoring and Replacement   Recent Labs: Potassium (mmol/L)  Date Value  04/22/2023 4.0   Calcium (mg/dL)  Date Value  96/29/5284 8.5 (L)   Albumin (g/dL)  Date Value  13/24/4010 4.7   Sodium (mmol/L)  Date Value  04/22/2023 131 (L)     Assessment:  78 y.o. female w/ PMH of left-sided breast cancer status post left lumpectomy, hysterectomy, radiation therapy, usually in good state of health, being seen after presenting to the emergency room with neck pain following a fall down a flight of 12 steps. Pharmacy is asked to follow and replace electrolytes while in CCU  Goal of Therapy:  Electrolytes WNL  Plan:  ---no electrolyte replacement warranted for today ---recheck electrolytes in am  Kathryn Hart ,PharmD Clinical Pharmacist 04/22/2023 7:03 AM

## 2023-04-22 NOTE — Progress Notes (Signed)
 eLink Physician-Brief Progress Note Patient Name: Kathryn Hart DOB: 04-19-1945 MRN: [id number]   Date of Service  04/22/2023  HPI/Events of Note  Patient admitted with C1 fracture, small right apical pneumothorax following a fall, she was admitted to the ICU for close overnight monitoring.  eICU Interventions  New Patient Evaluation        Marlynn Singer 04/22/2023, 3:08 AM

## 2023-04-22 NOTE — Assessment & Plan Note (Addendum)
 Close monitoring and ICU Pain control

## 2023-04-22 NOTE — Assessment & Plan Note (Addendum)
 Unsuccessful attempts at pigtail catheter placement in the ED Continue close monitoring in ICU given multiple trauma

## 2023-04-22 NOTE — Assessment & Plan Note (Signed)
 S/p left lumpectomy hysterectomy and radiation therapy

## 2023-04-22 NOTE — Assessment & Plan Note (Signed)
 Miami J collar Seen by neurosurgery in the ED

## 2023-04-22 NOTE — H&P (Signed)
 NAME:  Kathryn Hart, MRN:  [id number], DOB:  [birth year], LOS: 1 ADMISSION DATE:  04/21/2023  CHIEF COMPLAINT:  fall with PTX  BRIEF SYNOPSIS  History of Present Illness:  78 y.o. female with past medical history of left-sided breast cancer status post left lumpectomy, hysterectomy, radiation therapy, usually in good state of health, being seen in consultation after presenting to the emergency room with neck pain following a fall down a flight of 12 steps.    States her toddler grandchild was climbing up the stairs and she was going up behind her, playing with her along the way and then lost her footing falling down the stairs.  She had immediate onset of neck pain and upper back pain.  Workup in the ED showed normal vitals, mild leukocytosis of 16,000, mild hyponatremia of 131 Pan trauma scan most significant for C1 fracture without acute vascular injury and rib fractures with moderate right hemothorax with partial lung collapse as further detailed below:  IMPRESSION: 1. Moderate right pneumothorax with partial collapse of the right lung and mild relative hyperinflation of the right hemithorax which may reflect subtle changes of tension physiology. 2. Acute fractures of the right 5-9 ribs posteriorly with displacement of the right sixth rib fracture. 3. Superimposed airway impaction and tree-in-bud nodularity within the right upper and right lower lobes which may relate to acute or chronic infection. 4. 2.7 cm left thyroid nodule. This was previously described on thyroid sonogram 03/14/2017 and was better assessed on that examination. 5. Diffuse osteopenia. 6. Remote compression deformities of T5, L1, and L2. 7. Remote, nondisplaced insufficiency fracture of the right sacral   Neuro surgery was consulted from the ED and recommended a Miami J cervical collar MULTIPLE ATTEMPTS BY ER  placement of chest tube/ pigtail catheter was unsuccessful. PCCM asked to admit since [facility]  states not in their scope of practice to admit PTX PCCM admitted patient under SD status, no ICU meeds   At this time She is denying shortness of breath. Will obtain repeat CT chest to assess interval changes of PTX since ER stuck her lung    Significant Hospital Events: Including procedures, antibiotic start and stop dates in addition to other pertinent events   4/12 admitted for PTX, s/p fall, neck fracture      Interim History / Subjective:  VS stable Not on pressors or VENT Plan for repeat CT chest      Objective   Blood pressure (!) 140/71, pulse 64, temperature 98.6 F (37 C), resp. rate 10, height 5\' 2"  (1.575 m), weight 45.8 kg, SpO2 100%.        Intake/Output Summary (Last 24 hours) at 04/22/2023 0746 Last data filed at 04/22/2023 0400 Gross per 24 hour  Intake --  Output 100 ml  Net -100 ml   Filed Weights   04/21/23 1336 04/22/23 0346  Weight: 45.8 kg 45.8 kg      Review of Systems: Gen:  Denies  fever, sweats, chills weight loss  HEENT: Denies blurred vision, double vision, ear pain, eye pain, hearing loss, nose bleeds, sore throat Cardiac:  No dizziness, chest pain or heaviness, chest tightness,edema, No JVD Resp:   No cough, -sputum production, -shortness of breath,-wheezing, -hemoptysis,  Other:  All other systems negative   Physical Examination:   General Appearance: No distress  EYES PERRLA, EOM intact.   NECK Supple, No JVD C collar in place Pulmonary: normal breath sounds, No wheezing.  CardiovascularNormal S1,S2.  No m/r/g.  Abdomen: Benign, Soft, non-tender. Neurology UE/LE 5/5 strength, no focal deficits Ext pulses intact, cap refill intact ALL OTHER ROS ARE NEGATIVE  Labs/imaging that I havepersonally reviewed  (right click and "Reselect all SmartList Selections" daily)      ASSESSMENT AND PLAN SYNOPSIS  ACUTE LEFT SIDED PTX FROM FALL Repeat CT chest pending Oxygen as needed Pain control  CARDIAC ICU  monitoring   ENDO - ICU hypoglycemic\Hyperglycemia protocol -check FSBS per protocol   GI GI PROPHYLAXIS as indicated NUTRITIONAL STATUS DIET-->TF's as tolerated Constipation protocol as indicated   ELECTROLYTES -follow labs as needed -replace as needed -pharmacy consultation and following  RESTRICTIVE TRANSFUSION PROTOCOL TRANSFUSION  IF HGB<7  or ACTIVE BLEEDING OR DX of ACUTE CORONARY SYNDROMES      Best practice (right click and "Reselect all SmartList Selections" daily)  Diet: as tolerated Code Status:  FULL CODE Disposition: ICU  Labs   CBC: Recent Labs  Lab 04/21/23 1505  WBC 16.1*  NEUTROABS 15.1*  HGB 12.4  HCT 36.2  MCV 90.3  PLT 389    Basic Metabolic Panel: Recent Labs  Lab 04/21/23 1505 04/22/23 0311  NA 134* 131*  K 4.3 4.0  CL 99 96*  CO2 28 29  GLUCOSE 164* 135*  BUN 8 7*  CREATININE 0.75 0.57  CALCIUM 8.8* 8.5*   GFR: Estimated Creatinine Clearance: 41.2 mL/min (by C-G formula based on SCr of 0.57 mg/dL). Recent Labs  Lab 04/21/23 1505  WBC 16.1*    Liver Function Tests: No results for input(s): "AST", "ALT", "ALKPHOS", "BILITOT", "PROT", "ALBUMIN" in the last 168 hours. No results for input(s): "LIPASE", "AMYLASE" in the last 168 hours. No results for input(s): "AMMONIA" in the last 168 hours.  ABG No results found for: "PHART", "PCO2ART", "PO2ART", "HCO3", "TCO2", "ACIDBASEDEF", "O2SAT"   Coagulation Profile: No results for input(s): "INR", "PROTIME" in the last 168 hours.  Cardiac Enzymes: No results for input(s): "CKTOTAL", "CKMB", "CKMBINDEX", "TROPONINI" in the last 168 hours.  HbA1C: Hgb A1c MFr Bld  Date/Time Value Ref Range Status  09/30/2016 03:48 AM 5.1 4.8 - 5.6 % Final    Comment:    (NOTE) Pre diabetes:          5.7%-6.4% Diabetes:              >6.4% Glycemic control for   <7.0% adults with diabetes     CBG: Recent Labs  Lab 04/22/23 0128  GLUCAP 115*    Allergies No Known Allergies      Patient  satisfied with Plan of action and management. All questions answered     I spent a total of 85 minutes reviewing chart data, face-to-face evaluation with the patient, counseling and coordination of care as detailed above.      Lady Pier, M.D.  [employer] [facility] Pulmonary & Critical Care Medicine  Medical Director [facility] Medical Director [facility] Cardio-Pulmonary Department

## 2023-04-22 NOTE — Assessment & Plan Note (Addendum)
 Polytrauma, suspecting accidental fall Syncope not suspected, though does have a history of syncope back in 2019 Recommended observation in the ICU to ensure stability versus transfer to trauma at [facility]

## 2023-04-22 NOTE — Consult Note (Signed)
 Initial Consultation Note   Patient: Kathryn Hart WUJ:[id number] DOB: 08/26/1945 PCP: Alexander Anes, MD DOA: 04/21/2023 DOS: the patient was seen and examined on 04/22/2023 Primary service: Cleve Dale, MD  Referring physician: ED provider Dr. Hendrick Locke Reason for consult: Pneumothorax  Assessment/Plan: Assessment and Plan: * Pneumothorax on right, moderate Unsuccessful attempts at pigtail catheter placement in the ED Continue close monitoring in ICU given multiple trauma   Multiple rib fractures involving four or more ribs, right 5-9 with displaced 6th Close monitoring and ICU Pain control  C1 cervical fracture (HCC) Miami J collar Seen by neurosurgery in the ED  Fall down 12 stairs Polytrauma, suspecting accidental fall Syncope not suspected, though does have a history of syncope back in 2019 Recommended observation in the ICU to ensure stability versus transfer to trauma at [facility]  History of breast cancer S/p left lumpectomy hysterectomy and radiation therapy  Underweight (BMI < 18.5) Consider dietary consult  Osteopenia Remote compression deformities T5 L1 and L2  Remote nondisplaced insufficiency fracture right sacrum on CT Might benefit from outpatient treatment of osteopenia/endocrinology referral     [facility] will continue to follow the patient.  HPI: Kathryn Hart is a 78 y.o. female with past medical history of left-sided breast cancer status post left lumpectomy, hysterectomy, radiation therapy, usually in good state of health, being seen in consultation after presenting to the emergency room with neck pain following a fall down a flight of 12 steps.  States her toddler grandchild was climbing up the stairs and she was going up behind her, playing with her along the way and then lost her footing falling down the stairs.  She had immediate onset of neck pain and upper back pain. Workup in the ED showed normal vitals, mild leukocytosis of  16,000, mild hyponatremia of 131 Pan trauma scan most significant for C1 fracture without acute vascular injury and rib fractures with moderate right hemothorax with partial lung collapse as further detailed below: IMPRESSION: 1. Moderate right pneumothorax with partial collapse of the right lung and mild relative hyperinflation of the right hemithorax which may reflect subtle changes of tension physiology. 2. Acute fractures of the right 5-9 ribs posteriorly with displacement of the right sixth rib fracture. 3. Superimposed airway impaction and tree-in-bud nodularity within the right upper and right lower lobes which may relate to acute or chronic infection. 4. 2.7 cm left thyroid nodule. This was previously described on thyroid sonogram 03/14/2017 and was better assessed on that examination. 5. Diffuse osteopenia. 6. Remote compression deformities of T5, L1, and L2. 7. Remote, nondisplaced insufficiency fracture of the right sacral  Neuro surgery was consulted from the ED and recommended a Miami J cervical collar Pulmonology was consulted and recommended placement of chest tube however attempted placement of pigtail catheter was unsuccessful. ICU was consulted and deferred admission to hospitalist service, however after further review and discussion with ED provider Dr. Hendrick Locke, given patient not currently having a chest tube.  Recommendation made either for admission to ICU or transferred to trauma service at [facility].  Once patient remains stable, she can be transferred to the hospitalist service at that time.   Currently patient complains of neck and upper back pain.  She is denying shortness of breath..  Review of Systems: As mentioned in the history of present illness. All other systems reviewed and are negative. Past Medical History:  Diagnosis Date   Cancer (HCC) 05/2014   left sided breast cancer   COPD (chronic  obstructive pulmonary disease) (HCC)    mild   Headache     Personal history of radiation therapy 2016   No Chemo   Wears partial dentures    top   Past Surgical History:  Procedure Laterality Date   ABDOMINAL HYSTERECTOMY     BREAST LUMPECTOMY Left 05/2014   BROW LIFT Bilateral 09/30/2022   Procedure: BLEPHAROPLASTY UPPER EYELID; W/EXCESS SKIN BILATERAL;  Surgeon: Zacarias Hermann, MD;  Location: [facility];  Service: Ophthalmology;  Laterality: Bilateral;   CATARACT EXTRACTION W/PHACO Right 07/17/2019   Procedure: CATARACT EXTRACTION PHACO AND INTRAOCULAR LENS PLACEMENT (IOC) RIGHT PANOPTIX  TORIC LENS;  Surgeon: Annell Kidney, MD;  Location: [facility];  Service: Ophthalmology;  Laterality: Right;  10.72 1:20.2 13.3%   CATARACT EXTRACTION W/PHACO Left 09/18/2019   Procedure: CATARACT EXTRACTION PHACO AND INTRAOCULAR LENS PLACEMENT (IOC) LEFT PANOPTIX TORIC LENS 13.51  01:21.1  16.7%;  Surgeon: Annell Kidney, MD;  Location: [facility];  Service: Ophthalmology;  Laterality: Left;   Social History:  reports that she quit smoking about 5 years ago. Her smoking use included cigarettes. She started smoking about 25 years ago. She has a 10 pack-year smoking history. She has never used smokeless tobacco. She reports current alcohol use of about 2.0 standard drinks of alcohol per week. No history on file for drug use.  No Known Allergies  History reviewed. No pertinent family history.  Prior to Admission medications   Medication Sig Start Date End Date Taking? Authorizing Provider  buPROPion (ZYBAN) 150 MG 12 hr tablet Take 150 mg by mouth 2 (two) times daily. 03/22/23  Yes [provider]  tiZANidine (ZANAFLEX) 4 MG tablet Take 4 mg by mouth 3 (three) times daily as needed. 09/16/22 09/16/23 Yes [provider]  traZODone (DESYREL) 100 MG tablet Take 100 mg by mouth at bedtime.   Yes [provider]  alendronate (FOSAMAX) 70 MG tablet Take 70 mg by mouth once a week. Take with a full glass of  water on an empty stomach. Patient not taking: Reported on 04/21/2023    [provider]  buPROPion (WELLBUTRIN SR) 150 MG 12 hr tablet Take 150 mg by mouth daily. Patient not taking: Reported on 04/21/2023    [provider]  cholecalciferol (VITAMIN D3) 25 MCG (1000 UNIT) tablet Take 2,000 Units by mouth daily. Patient not taking: Reported on 04/21/2023    [provider]  erythromycin ophthalmic ointment Apply to sutures 4 times a day for 10-12 days.  Discontinue if allergy develops and call our office Patient not taking: Reported on 04/21/2023 09/30/22   Zacarias Hermann, MD  traMADol (ULTRAM) 50 MG tablet Take 1 every 4-6 hours as needed for pain not controlled by Tylenol Patient not taking: Reported on 04/21/2023 09/30/22   Zacarias Hermann, MD    Physical Exam: Vitals:   04/22/23 0200 04/22/23 0300 04/22/23 0346 04/22/23 0400  BP: (!) 154/76 (!) 154/74  (!) 141/69  Pulse: 84 70  63  Resp: (!) 23 19  14   Temp:    98.6 F (37 C)  TempSrc:      SpO2: 98% 97%  97%  Weight:   45.8 kg   Height:       Physical Exam Vitals and nursing note reviewed.  Constitutional:      General: She is not in acute distress.    Comments: Patient in cervical collar  HENT:     Head: Normocephalic and atraumatic.  Cardiovascular:  Rate and Rhythm: Normal rate and regular rhythm.     Heart sounds: Normal heart sounds.  Pulmonary:     Effort: Pulmonary effort is normal.     Breath sounds: Normal breath sounds.  Abdominal:     Palpations: Abdomen is soft.     Tenderness: There is no abdominal tenderness.  Neurological:     Mental Status: Mental status is at baseline.     Data Reviewed: Relevant notes from primary care and specialist visits, past discharge summaries as available in EHR, including Care Everywhere. Prior diagnostic testing as pertinent to current admission diagnoses Updated medications and problem lists for reconciliation ED course, including vitals, labs,  imaging, treatment and response to treatment Triage notes, nursing and pharmacy notes and ED provider's notes Notable results as noted in HPI   Family Communication: none Primary team communication: Dr Hendrick Locke Thank you very much for involving us  in the care of your patient.  Author: Lanetta Pion, MD 04/22/2023 4:40 AM  For on call review [email or web address].

## 2023-04-22 NOTE — Assessment & Plan Note (Addendum)
 Remote compression deformities T5 L1 and L2  Remote nondisplaced insufficiency fracture right sacrum on CT Might benefit from outpatient treatment of osteopenia/endocrinology referral

## 2023-04-22 NOTE — Plan of Care (Signed)
  Problem: Education: Goal: Knowledge of General Education information will improve Description: Including pain rating scale, medication(s)/side effects and non-pharmacologic comfort measures Outcome: Progressing   Problem: Clinical Measurements: Goal: Ability to maintain clinical measurements within normal limits will improve Outcome: Progressing Goal: Will remain free from infection Outcome: Progressing Goal: Diagnostic test results will improve Outcome: Progressing Goal: Cardiovascular complication will be avoided Outcome: Progressing   Problem: Nutrition: Goal: Adequate nutrition will be maintained Outcome: Progressing   Problem: Pain Managment: Goal: General experience of comfort will improve and/or be controlled Outcome: Progressing   Problem: Safety: Goal: Ability to remain free from injury will improve Outcome: Progressing   Problem: Skin Integrity: Goal: Risk for impaired skin integrity will decrease Outcome: Progressing

## 2023-04-22 NOTE — Assessment & Plan Note (Signed)
 Consider dietary consult

## 2023-04-23 ENCOUNTER — Inpatient Hospital Stay

## 2023-04-23 DIAGNOSIS — J939 Pneumothorax, unspecified: Secondary | ICD-10-CM | POA: Diagnosis not present

## 2023-04-23 LAB — SODIUM: Sodium: 122 mmol/L — ABNORMAL LOW (ref 135–145)

## 2023-04-23 LAB — RENAL FUNCTION PANEL
Albumin: 3.3 g/dL — ABNORMAL LOW (ref 3.5–5.0)
Anion gap: 5 (ref 5–15)
BUN: 6 mg/dL — ABNORMAL LOW (ref 8–23)
CO2: 28 mmol/L (ref 22–32)
Calcium: 8.4 mg/dL — ABNORMAL LOW (ref 8.9–10.3)
Chloride: 90 mmol/L — ABNORMAL LOW (ref 98–111)
Creatinine, Ser: 0.63 mg/dL (ref 0.44–1.00)
GFR, Estimated: >60 mL/min (ref >60)
Glucose, Bld: 97 mg/dL (ref 70–99)
Phosphorus: 2.6 mg/dL (ref 2.5–4.6)
Potassium: 3.9 mmol/L (ref 3.5–5.1)
Sodium: 123 mmol/L — ABNORMAL LOW (ref 135–145)

## 2023-04-23 LAB — CBC
HCT: 33.3 % — ABNORMAL LOW (ref 36.0–46.0)
Hemoglobin: 11.8 g/dL — ABNORMAL LOW (ref 12.0–15.0)
MCH: 30.3 pg (ref 26.0–34.0)
MCHC: 35.4 g/dL (ref 30.0–36.0)
MCV: 85.4 fL (ref 80.0–100.0)
Platelets: 359 10*3/uL (ref 150–400)
RBC: 3.9 MIL/uL (ref 3.87–5.11)
RDW: 13.1 % (ref 11.5–15.5)
WBC: 5.7 10*3/uL (ref 4.0–10.5)
nRBC: 0 % (ref 0.0–0.2)

## 2023-04-23 LAB — OSMOLALITY, URINE: Osmolality, Ur: 382 mosm/kg (ref 300–900)

## 2023-04-23 LAB — SODIUM, URINE, RANDOM: Sodium, Ur: 64 mmol/L

## 2023-04-23 LAB — MAGNESIUM: Magnesium: 2.1 mg/dL (ref 1.7–2.4)

## 2023-04-23 LAB — OSMOLALITY: Osmolality: 253 mosm/kg — ABNORMAL LOW (ref 275–295)

## 2023-04-23 MED ORDER — SODIUM CHLORIDE 0.9 % IV SOLN
12.5000 mg | Freq: Once | INTRAVENOUS | Status: AC
  Administered 2023-04-23: 12.5 mg via INTRAVENOUS
  Filled 2023-04-23: qty 0.5

## 2023-04-23 MED ORDER — SODIUM CHLORIDE 0.9 % IV SOLN
12.5000 mg | Freq: Four times a day (QID) | INTRAVENOUS | Status: DC | PRN
  Administered 2023-04-23: 12.5 mg via INTRAVENOUS
  Filled 2023-04-23: qty 0.5

## 2023-04-23 MED ORDER — SODIUM CHLORIDE 0.9 % IV SOLN: INTRAVENOUS | Status: AC

## 2023-04-23 NOTE — Progress Notes (Signed)
 Progress Note   Patient: Kathryn Hart ZOX:[id number] DOB: [birth year] DOA: 04/21/2023     2 DOS: the patient was seen and examined on 04/23/2023   Brief hospital course: 78 y.o. female with past medical history of left-sided breast cancer status post left lumpectomy, hysterectomy, radiation therapy, usually in good state of health, being seen in consultation after presenting to the emergency room with neck pain following a fall down a flight of 12 steps.     States her toddler grandchild was climbing up the stairs and she was going up behind her, playing with her along the way and then lost her footing falling down the stairs.  She had immediate onset of neck pain and upper back pain.   Workup in the ED showed normal vitals, mild leukocytosis of 16,000, mild hyponatremia of 131 Pan trauma scan most significant for C1 fracture without acute vascular injury and rib fractures with moderate right hemothorax with partial lung collapse as further detailed below:     Assessment and Plan:  * Pneumothorax on right, moderate Unsuccessful attempts at pigtail catheter placement in the ED Status post chest tube insertion CT chest without contrast from 04/12 showed small-moderate anterior right pneumothorax, perhaps slightly decreased from previous. Small right pleural effusion. Consolidation/atelectasis of the right middle lobe, and posteriorly in the right upper and lower lobes. Stable patchy consolidations/atelectasis in the inferior lingula. Repeat chest x-ray 04/13 showed interval decrease in tiny right apical pneumothorax. Right pleural drain remains in place Appreciate pulmonary input     Multiple rib fractures involving four or more ribs, right 5-9 with displaced 6th Incentive spirometry Pain control    C1 cervical fracture (HCC) Appreciate neurosurgery input Recommends C collar until follow-up as an outpatient     Fall down 12 stairs Polytrauma, suspecting accidental  fall Status post accidental fall.  Denies loss of consciousness Complains of back pain Follow-up results of MRI of the spine to rule out acute fractures    History of breast cancer S/p left lumpectomy hysterectomy and radiation therapy    Underweight (BMI < 18.5) Consider dietary consult    Osteopenia Remote compression deformities T5 L1 and L2  Remote nondisplaced insufficiency fracture right sacrum on CT Might benefit from outpatient treatment of osteopenia/endocrinology referral      Hyponatremia Unclear etiology Will hydrate with normal saline and repeat sodium levels Check serum and urine osmolality as well as urine sodium levels     Subjective: Patient is seen and examined at the bedside.  Complains of hiccups and low back pain  Physical Exam: Vitals:   04/23/23 0419 04/23/23 0500 04/23/23 0811 04/23/23 1120  BP: (!) 145/70  (!) 150/66 (!) 98/49  Pulse: 76  73 76  Resp: 18  17 17   Temp: 98.4 F (36.9 C)  97.8 F (36.6 C) 97.8 F (36.6 C)  TempSrc: Oral  Oral Oral  SpO2: 96%  92% 95%  Weight:  49.8 kg    Height:       Vitals and nursing note reviewed.  Constitutional:      General: She is not in acute distress.    Comments: Patient in cervical collar  HENT:     Head: Normocephalic and atraumatic.  Cardiovascular:     Rate and Rhythm: Normal rate and regular rhythm.     Heart sounds: Normal heart sounds.  Pulmonary:     Effort: Pulmonary effort is normal.     Breath sounds: Normal breath sounds.  Abdominal:  Palpations: Abdomen is soft.     Tenderness: There is no abdominal tenderness.  Neurological:     Mental Status: Mental status is at baseline.      Data Reviewed: Sodium level 123, chloride 90, serum osmolality 253, urine sodium 64 Labs reviewed  Family Communication: Plan of care discussed with patient  Disposition: Status is: Inpatient Remains inpatient appropriate because: Has a chest tube in place for pneumothorax  Planned  Discharge Destination: TBD    Time spent: 40  minutes  Author: Read Camel, MD 04/23/2023 12:46 PM  For on call review [email or web address].

## 2023-04-23 NOTE — Progress Notes (Signed)
 Report given to nurse assuming care of patient, Kathryn Hart.

## 2023-04-23 NOTE — Progress Notes (Signed)
 NAME:  Kathryn Hart, MRN:  [id number], DOB:  1945-07-16, LOS: 2 ADMISSION DATE:  04/21/2023  CHIEF COMPLAINT:  fall with PTX    History of Present Illness:  78 y.o. female with past medical history of left-sided breast cancer status post left lumpectomy, hysterectomy, radiation therapy, usually in good state of health, being seen in consultation after presenting to the emergency room with neck pain following a fall down a flight of 12 steps.    States her toddler grandchild was climbing up the stairs and she was going up behind her, playing with her along the way and then lost her footing falling down the stairs.  She had immediate onset of neck pain and upper back pain.  Workup in the ED showed normal vitals, mild leukocytosis of 16,000, mild hyponatremia of 131 Pan trauma scan most significant for C1 fracture without acute vascular injury and rib fractures with moderate right hemothorax with partial lung collapse as further detailed below:  IMPRESSION: 1. Moderate right pneumothorax with partial collapse of the right lung and mild relative hyperinflation of the right hemithorax which may reflect subtle changes of tension physiology. 2. Acute fractures of the right 5-9 ribs posteriorly with displacement of the right sixth rib fracture. 3. Superimposed airway impaction and tree-in-bud nodularity within the right upper and right lower lobes which may relate to acute or chronic infection. 4. 2.7 cm left thyroid nodule. This was previously described on thyroid sonogram 03/14/2017 and was better assessed on that examination. 5. Diffuse osteopenia. 6. Remote compression deformities of T5, L1, and L2. 7. Remote, nondisplaced insufficiency fracture of the right sacral   Neuro surgery was consulted from the ED and recommended a Miami J cervical collar MULTIPLE ATTEMPTS BY ER  placement of chest tube/ pigtail catheter was unsuccessful. PCCM asked to admit since [facility] states not in  their scope of practice to admit PTX PCCM admitted patient under SD status, no ICU meeds   At this time She is denying shortness of breath. Will obtain repeat CT chest to assess interval changes of PTX since ER stuck her lung    Significant Hospital Events: Including procedures, antibiotic start and stop dates in addition to other pertinent events   4/12 admitted for PTX, s/p fall, neck fracture 4/12 chest tube placed 4/13 CXR very minimal RT PTX, placed on water seal      Interim History / Subjective:  Chest tube in place Alert and awake Still having back pain MRI PENDING Placed on water seal      Objective   Blood pressure (!) 98/49, pulse 76, temperature 97.8 F (36.6 C), temperature source Oral, resp. rate 17, height 5\' 2"  (1.575 m), weight 49.8 kg, SpO2 95%.        Intake/Output Summary (Last 24 hours) at 04/23/2023 1424 Last data filed at 04/23/2023 0900 Gross per 24 hour  Intake 720 ml  Output 1142 ml  Net -422 ml   Filed Weights   04/21/23 1336 04/22/23 0346 04/23/23 0500  Weight: 45.8 kg 45.8 kg 49.8 kg       Review of Systems: Gen:  Denies  fever, sweats, chills weight loss  HEENT: Denies blurred vision, double vision, ear pain, eye pain, hearing loss, nose bleeds, sore throat Cardiac:  No dizziness, chest pain or heaviness, chest tightness,edema, No JVD Resp:   No cough, -sputum production, -shortness of breath,-wheezing, -hemoptysis,  Other:  All other systems negative   Physical Examination:   General Appearance: No distress  EYES  PERRLA, EOM intact.   NECK Supple, No JVD Pulmonary: normal breath sounds, No wheezing.  CardiovascularNormal S1,S2.  No m/r/g.   Abdomen: Benign, Soft, non-tender. Neurology UE/LE 5/5 strength, no focal deficits Ext pulses intact, cap refill intact ALL OTHER ROS ARE NEGATIVE  Labs/imaging that I havepersonally reviewed  (right click and "Reselect all SmartList Selections" daily)      ASSESSMENT AND  PLAN SYNOPSIS  ACUTE LEFT SIDED PTX FROM FALL CHEST TUBE PLACED 4/12 CXR follow up CXR shows resolution Placed on water seal today and will repeat CXR tomorrow AM Oxygen as needed Pain control  CARDIAC ICU monitoring     Patient  satisfied with Plan of action and management. All questions answered     I spent a total of 35 minutes reviewing chart data, face-to-face evaluation with the patient, counseling and coordination of care as detailed above.      Lady Pier, M.D.  [facility]  Medical Director [employer] [facility] Medical Director [facility] Cardio-Pulmonary Department

## 2023-04-23 NOTE — Progress Notes (Signed)
 PHARMACY CONSULT NOTE - FOLLOW UP  Pharmacy Consult for Electrolyte Monitoring and Replacement   Recent Labs: Potassium (mmol/L)  Date Value  04/23/2023 3.9   Magnesium (mg/dL)  Date Value  36/64/4034 2.1   Calcium (mg/dL)  Date Value  74/25/9563 8.4 (L)   Albumin (g/dL)  Date Value  87/56/4332 3.3 (L)   Phosphorus (mg/dL)  Date Value  95/18/8416 2.6   Sodium (mmol/L)  Date Value  04/23/2023 123 (L)     Assessment:  78 y.o. female w/ PMH of left-sided breast cancer status post left lumpectomy, hysterectomy, radiation therapy, usually in good state of health, being seen after presenting to the emergency room with neck pain following a fall down a flight of 12 steps. Pharmacy is asked to follow and replace electrolytes while in CCU  Goal of Therapy:  Electrolytes WNL  Plan:  ---no electrolyte replacement warranted for today --Pharmacy will sign off CCM monitoring consult as patient no longer in ICU. May reconsult if desired  Scotty Cyphers ,PharmD Clinical Pharmacist 04/23/2023 9:11 AM

## 2023-04-23 NOTE — Plan of Care (Signed)

## 2023-04-23 NOTE — Plan of Care (Signed)

## 2023-04-24 ENCOUNTER — Inpatient Hospital Stay

## 2023-04-24 DIAGNOSIS — E871 Hypo-osmolality and hyponatremia: Secondary | ICD-10-CM | POA: Diagnosis present

## 2023-04-24 DIAGNOSIS — J939 Pneumothorax, unspecified: Secondary | ICD-10-CM | POA: Diagnosis not present

## 2023-04-24 DIAGNOSIS — S12000A Unspecified displaced fracture of first cervical vertebra, initial encounter for closed fracture: Secondary | ICD-10-CM | POA: Diagnosis not present

## 2023-04-24 DIAGNOSIS — S32040K Wedge compression fracture of fourth lumbar vertebra, subsequent encounter for fracture with nonunion: Secondary | ICD-10-CM | POA: Diagnosis present

## 2023-04-24 DIAGNOSIS — W108XXA Fall (on) (from) other stairs and steps, initial encounter: Secondary | ICD-10-CM | POA: Diagnosis not present

## 2023-04-24 LAB — BASIC METABOLIC PANEL WITH GFR
Anion gap: 6 (ref 5–15)
BUN: 7 mg/dL — ABNORMAL LOW (ref 8–23)
CO2: 25 mmol/L (ref 22–32)
Calcium: 7.7 mg/dL — ABNORMAL LOW (ref 8.9–10.3)
Chloride: 90 mmol/L — ABNORMAL LOW (ref 98–111)
Creatinine, Ser: 0.66 mg/dL (ref 0.44–1.00)
GFR, Estimated: >60 mL/min (ref >60)
Glucose, Bld: 108 mg/dL — ABNORMAL HIGH (ref 70–99)
Potassium: 4.1 mmol/L (ref 3.5–5.1)
Sodium: 121 mmol/L — ABNORMAL LOW (ref 135–145)

## 2023-04-24 LAB — CBC
HCT: 29 % — ABNORMAL LOW (ref 36.0–46.0)
Hemoglobin: 10.2 g/dL — ABNORMAL LOW (ref 12.0–15.0)
MCH: 29.5 pg (ref 26.0–34.0)
MCHC: 35.2 g/dL (ref 30.0–36.0)
MCV: 83.8 fL (ref 80.0–100.0)
Platelets: 242 10*3/uL (ref 150–400)
RBC: 3.46 MIL/uL — ABNORMAL LOW (ref 3.87–5.11)
RDW: 13.1 % (ref 11.5–15.5)
WBC: 3.8 10*3/uL — ABNORMAL LOW (ref 4.0–10.5)
nRBC: 0 % (ref 0.0–0.2)

## 2023-04-24 MED ORDER — CALCIUM CARBONATE 1250 (500 CA) MG PO TABS
1.0000 | ORAL_TABLET | Freq: Two times a day (BID) | ORAL | Status: DC
  Administered 2023-04-25 – 2023-04-26 (×3): 1250 mg via ORAL
  Filled 2023-04-24 (×5): qty 1

## 2023-04-24 MED ORDER — SENNOSIDES-DOCUSATE SODIUM 8.6-50 MG PO TABS
2.0000 | ORAL_TABLET | Freq: Every day | ORAL | Status: DC
  Administered 2023-04-24 – 2023-04-25 (×2): 2 via ORAL
  Filled 2023-04-24 (×2): qty 2

## 2023-04-24 MED ORDER — POLYETHYLENE GLYCOL 3350 17 G PO PACK
17.0000 g | PACK | Freq: Every day | ORAL | Status: DC
  Administered 2023-04-24 – 2023-04-26 (×3): 17 g via ORAL
  Filled 2023-04-24 (×3): qty 1

## 2023-04-24 MED ORDER — ENSURE ENLIVE PO LIQD
237.0000 mL | Freq: Two times a day (BID) | ORAL | Status: DC
  Administered 2023-04-24 – 2023-04-26 (×4): 237 mL via ORAL

## 2023-04-24 NOTE — Progress Notes (Signed)
 NAME:  Kathryn Hart, MRN:  [id number], DOB:  21-Nov-1945, LOS: 3 ADMISSION DATE:  04/21/2023  History of Present Illness:  78 y.o. female with past medical history of left-sided breast cancer status post left lumpectomy, hysterectomy, radiation therapy, usually in good state of health, being seen in consultation after presenting to the emergency room with neck pain following a fall down a flight of 12 steps.     States her toddler grandchild was climbing up the stairs and she was going up behind her, playing with her along the way and then lost her footing falling down the stairs.  She had immediate onset of neck pain and upper back pain.   Workup in the ED showed normal vitals, mild leukocytosis of 16,000, mild hyponatremia of 131 Pan trauma scan most significant for C1 fracture without acute vascular injury and rib fractures with moderate right hemothorax with partial lung collapse as further detailed below:   IMPRESSION: 1. Moderate right pneumothorax with partial collapse of the right lung and mild relative hyperinflation of the right hemithorax which may reflect subtle changes of tension physiology. 2. Acute fractures of the right 5-9 ribs posteriorly with displacement of the right sixth rib fracture. 3. Superimposed airway impaction and tree-in-bud nodularity within the right upper and right lower lobes which may relate to acute or chronic infection. 4. 2.7 cm left thyroid nodule. This was previously described on thyroid sonogram 03/14/2017 and was better assessed on that examination. 5. Diffuse osteopenia. 6. Remote compression deformities of T5, L1, and L2. 7. Remote, nondisplaced insufficiency fracture of the right sacral   Neuro surgery was consulted from the ED and recommended a Miami J cervical collar MULTIPLE ATTEMPTS BY ER  placement of chest tube/ pigtail catheter was unsuccessful. PCCM asked to admit since [facility] states not in their scope of practice to admit  PTX PCCM admitted patient under SD status, no ICU meeds     At this time She is denying shortness of breath. Will obtain repeat CT chest to assess interval changes of PTX since ER stuck her lung  Significant Hospital Events: Including procedures, antibiotic start and stop dates in addition to other pertinent events   4/12 admitted for PTX, s/p fall, neck fracture 4/12 chest tube placed 4/13 CXR very minimal RT PTX, placed on water seal  Interim History / Subjective:  Patient reports feeling better and breathing is better.   Objective   Blood pressure (!) 101/50, pulse 66, temperature 98.2 F (36.8 C), temperature source Oral, resp. rate 18, height 5\' 2"  (1.575 m), weight 51.1 kg, SpO2 97%.        Intake/Output Summary (Last 24 hours) at 04/24/2023 1235 Last data filed at 04/24/2023 1100 Gross per 24 hour  Intake 1255.08 ml  Output 1208 ml  Net 47.08 ml   Filed Weights   04/22/23 0346 04/23/23 0500 04/24/23 0500  Weight: 45.8 kg 49.8 kg 51.1 kg    Examination: General: NAD, comfortable in C collar.  HENT: Supple neck, reactive pupils0 Lungs: Diminished air entry at the bases Cardiovascular: Normal S1, Normal S2, RRR  Abdomen: Soft, non tender, non distended + BS  Extremities: Warm and well perfused  Labs and imaging were reviewed.   Assessment & Plan:   78 years old female patient presenting after a fall found to have a C1 vertebral fracture, multiple rib fractures and a right sided pneumothorax s/p chest tube placement 04/12 with complete evacuation.   #Mechanical fall complicated by  #Multiple rib fractures  right 5-9 with displaced 6th c/b right pneumothorax  #C1 Cervical fracture.   []  Chest tube clamp trial. CXR at 14:30 if stable will go ahead and remove the chest tube.  []  Pain management and rest of care per primary team.   I spent 50 minutes caring for this patient today, including preparing to see the patient, obtaining a medical history , reviewing a  separately obtained history, performing a medically appropriate examination and/or evaluation, counseling and educating the patient/family/caregiver, ordering medications, tests, or procedures, documenting clinical information in the electronic health record, and independently interpreting results (not separately reported/billed) and communicating results to the patient/family/caregiver  Annitta Kindler, MD [facility] 04/24/2023 12:48 PM

## 2023-04-24 NOTE — Progress Notes (Signed)
 Attending Progress Note  History: Kathryn Hart is here for polytrauma.  We are currently following for a C1 lateral mass fracture as well as a new diagnosis of a thoracic compression fracture.  Physical Exam: Vitals:   04/24/23 0744 04/24/23 1116  BP: 111/60 (!) 101/50  Pulse: 67 66  Resp: 18 18  Temp: 98.2 F (36.8 C) 98.2 F (36.8 C)  SpO2: 96% 97%    AA Ox3 CNI  Strength: No major gross deficits noted  Data:  Recent Labs  Lab 04/22/23 0311 04/23/23 0454 04/23/23 0850 04/24/23 0850  NA 131* 123*   < > 121*  K 4.0 3.9  --  4.1  CL 96* 90*  --  90*  CO2 29 28  --  25  BUN 7* 6*  --  7*  CREATININE 0.57 0.63  --  0.66  GLUCOSE 135* 97  --  108*  CALCIUM 8.5* 8.4*  --  7.7*   < > = values in this interval not displayed.   No results for input(s): "AST", "ALT", "ALKPHOS" in the last 168 hours.  Invalid input(s): "TBILI"   Recent Labs  Lab 04/21/23 1505 04/23/23 0454 04/24/23 0850  WBC 16.1* 5.7 3.8*  HGB 12.4 11.8* 10.2*  HCT 36.2 33.3* 29.0*  PLT 389 359 242   No results for input(s): "APTT", "INR" in the last 168 hours.      DG Chest Port 1 View Result Date: 04/24/2023 CLINICAL DATA:  Follow up pneumothorax. EXAM: PORTABLE CHEST 1 VIEW COMPARISON:  Radiographs 04/23/2023 and 04/22/2023.  CT 04/22/2023. FINDINGS: 0756 hours. Small caliber pigtail right chest tube is unchanged in position. No residual pneumothorax demonstrated. There is mild atelectasis at both lung bases. The heart size and mediastinal contours are stable. No significant chest wall emphysema identified. The bones appear unchanged. IMPRESSION: No residual pneumothorax demonstrated with right chest tube in place. Mild bibasilar atelectasis. Electronically Signed   By: Elmon Hagedorn M.D.   On: 04/24/2023 08:28   MR LUMBAR SPINE WO CONTRAST Result Date: 04/23/2023 CLINICAL DATA:  Initial evaluation for acute trauma, lower back pain. EXAM: MRI LUMBAR SPINE WITHOUT CONTRAST MRI SACRUM  WITHOUT CONTRAST TECHNIQUE: Multiplanar, multisequence MR imaging of the lumbar spine and sacrum was performed. No intravenous contrast was administered. COMPARISON:  Comparison made with prior CT from 04/21/2023 and MRI from 09/16/2021. FINDINGS: MRI LUMBAR SPINE FINDINGS Segmentation: Standard. Lowest well-formed disc space labeled the L5-S1 level. Alignment: 5 mm anterolisthesis of L4 on L5, with 3 mm anterolisthesis of L5 on S1. Findings chronic and facet mediated. Underlying mild levoscoliosis. Vertebrae: Subtle acute compression fracture with marrow edema seen involving the inferior endplate of L4 without significant height loss or retropulsion (series 3, image 7). No other acute or recent fracture within the lumbar spine. Chronic compression deformity of L1 with up to 80% central height loss and 6 mm bony retropulsion. Additional mild chronic compression deformity of L2 with mild 25% height loss and no more than trace bony retropulsion. Vertebral body height otherwise maintained. Bone marrow signal intensity within normal limits. No worrisome osseous lesions. No other abnormal marrow edema. Conus medullaris and cauda equina: Conus extends to the L1-2 level. Conus and cauda equina appear normal. Paraspinal and other soft tissues: Paraspinous soft tissues within normal limits. Disc levels: T12-L1: Disc desiccation with mild disc bulge. 6 mm bony retropulsion related to the chronic L1 fracture. Mild spinal stenosis. Mild left L1 foraminal narrowing. Right neural foramen remains patent. L1-2: Disc desiccation without  disc bulge. No canal or foraminal stenosis. L2-3: Disc desiccation without disc bulge. Trace bony retropulsion related to the L2 fracture. Mild facet hypertrophy. Resultant mild left with borderline right lateral recess stenosis. Central canal remains patent. Mild to moderate bilateral L2 foraminal stenosis. L3-4: Minimal disc bulge. Mild facet and ligament flavum hypertrophy. No significant spinal  stenosis. Foramina remain patent. L4-5: Anterolisthesis. Disc desiccation with mild disc bulge. Moderate facet and ligament flavum hypertrophy. No significant spinal stenosis. Mild bilateral L4 foraminal narrowing. L5-S1: Trace anterolisthesis. Disc desiccation with mild disc bulge. Reactive endplate spurring on the right. Mild facet hypertrophy. No spinal stenosis. Foramina remain patent. MRI SACRUM FINDINGS Sacrum intact without acute or recent fracture. SI joints symmetric and within normal limits. No evidence for acute or chronic sacroiliitis. Visualized pelvis intact. Bone marrow signal intensity within normal limits. No worrisome osseous lesions. Symmetric edema noted within the gluteus minimus musculature bilaterally, nonspecific, but possibly related to overall volume status. Visualized musculature otherwise unremarkable. Visualized visceral structures within normal limits. Normal vascular flow void seen within the visualized pelvis. No visible adenopathy. IMPRESSION: MRI LUMBAR SPINE: 1. Subtle acute compression fracture involving the inferior endplate of L4 without significant height loss or retropulsion. 2. 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 3. Underlying multilevel lumbar spondylosis and facet hypertrophy as above. No high-grade spinal stenosis. Mild to moderate bilateral L2 foraminal narrowing, with mild bilateral L4 foraminal stenosis. MRI SACRUM: Negative MRI of the sacrum.  No acute fracture or other abnormality. Electronically Signed   By: Virgia Griffins M.D.   On: 04/23/2023 20:41   MR SACRUM SI JOINTS WO CONTRAST Result Date: 04/23/2023 CLINICAL DATA:  Initial evaluation for acute trauma, lower back pain. EXAM: MRI LUMBAR SPINE WITHOUT CONTRAST MRI SACRUM WITHOUT CONTRAST TECHNIQUE: Multiplanar, multisequence MR imaging of the lumbar spine and sacrum was performed. No intravenous contrast was administered. COMPARISON:   Comparison made with prior CT from 04/21/2023 and MRI from 09/16/2021. FINDINGS: MRI LUMBAR SPINE FINDINGS Segmentation: Standard. Lowest well-formed disc space labeled the L5-S1 level. Alignment: 5 mm anterolisthesis of L4 on L5, with 3 mm anterolisthesis of L5 on S1. Findings chronic and facet mediated. Underlying mild levoscoliosis. Vertebrae: Subtle acute compression fracture with marrow edema seen involving the inferior endplate of L4 without significant height loss or retropulsion (series 3, image 7). No other acute or recent fracture within the lumbar spine. Chronic compression deformity of L1 with up to 80% central height loss and 6 mm bony retropulsion. Additional mild chronic compression deformity of L2 with mild 25% height loss and no more than trace bony retropulsion. Vertebral body height otherwise maintained. Bone marrow signal intensity within normal limits. No worrisome osseous lesions. No other abnormal marrow edema. Conus medullaris and cauda equina: Conus extends to the L1-2 level. Conus and cauda equina appear normal. Paraspinal and other soft tissues: Paraspinous soft tissues within normal limits. Disc levels: T12-L1: Disc desiccation with mild disc bulge. 6 mm bony retropulsion related to the chronic L1 fracture. Mild spinal stenosis. Mild left L1 foraminal narrowing. Right neural foramen remains patent. L1-2: Disc desiccation without disc bulge. No canal or foraminal stenosis. L2-3: Disc desiccation without disc bulge. Trace bony retropulsion related to the L2 fracture. Mild facet hypertrophy. Resultant mild left with borderline right lateral recess stenosis. Central canal remains patent. Mild to moderate bilateral L2 foraminal stenosis. L3-4: Minimal disc bulge. Mild facet and ligament flavum hypertrophy. No significant spinal  stenosis. Foramina remain patent. L4-5: Anterolisthesis. Disc desiccation with mild disc bulge. Moderate facet and ligament flavum hypertrophy. No significant spinal  stenosis. Mild bilateral L4 foraminal narrowing. L5-S1: Trace anterolisthesis. Disc desiccation with mild disc bulge. Reactive endplate spurring on the right. Mild facet hypertrophy. No spinal stenosis. Foramina remain patent. MRI SACRUM FINDINGS Sacrum intact without acute or recent fracture. SI joints symmetric and within normal limits. No evidence for acute or chronic sacroiliitis. Visualized pelvis intact. Bone marrow signal intensity within normal limits. No worrisome osseous lesions. Symmetric edema noted within the gluteus minimus musculature bilaterally, nonspecific, but possibly related to overall volume status. Visualized musculature otherwise unremarkable. Visualized visceral structures within normal limits. Normal vascular flow void seen within the visualized pelvis. No visible adenopathy. IMPRESSION: MRI LUMBAR SPINE: 1. Subtle acute compression fracture involving the inferior endplate of L4 without significant height loss or retropulsion. 2. 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 3. Underlying multilevel lumbar spondylosis and facet hypertrophy as above. No high-grade spinal stenosis. Mild to moderate bilateral L2 foraminal narrowing, with mild bilateral L4 foraminal stenosis. MRI SACRUM: Negative MRI of the sacrum.  No acute fracture or other abnormality. Electronically Signed   By: Virgia Griffins M.D.   On: 04/23/2023 20:41   MR THORACIC SPINE WO CONTRAST Result Date: 04/23/2023 CLINICAL DATA:  Initial evaluation for acute mid back pain, fractures. EXAM: MRI THORACIC SPINE WITHOUT CONTRAST TECHNIQUE: Multiplanar, multisequence MR imaging of the thoracic spine was performed. No intravenous contrast was administered. COMPARISON:  Comparison made with prior CT from 04/21/2023 FINDINGS: Alignment: Exaggeration of the normal thoracic kyphosis. Trace degenerative anterolisthesis of T2 on T3. Vertebrae: Compression fracture involving  the T5 vertebral body with up to 25% height loss without bony retropulsion demonstrates persistent marrow edema, consistent with a probable evolving subacute fracture. Otherwise, vertebral body height maintained within the thoracic spine. No other acute or recent fracture. Bone marrow signal intensity within normal limits. Few small benign hemangiomata noted. No worrisome osseous lesions. No abnormal marrow edema. Note is made of a chronic L1 compression fracture as well, described on corresponding MRI of the lumbar spine. Cord:  Normal signal and morphology. Paraspinal and other soft tissues: Paraspinous soft tissues demonstrate no acute finding. Small layering bilateral pleural effusions, right larger than left. Disc levels: Ordinary for age multilevel disc desiccation with mild noncompressive disc bulging noted within the thoracic spine. No significant spinal stenosis. Foramina remain patent. IMPRESSION: 1. Subacute compression fracture involving the T5 vertebral body with up to 25% height loss without bony retropulsion. 2. No other acute osseous abnormality within the thoracic spine. 3. Small layering bilateral pleural effusions, right larger than left. Electronically Signed   By: Virgia Griffins M.D.   On: 04/23/2023 20:14   DG Chest Port 1 View Result Date: 04/23/2023 CLINICAL DATA:  Right-sided pneumothorax. EXAM: PORTABLE CHEST 1 VIEW COMPARISON:  04/22/2023 FINDINGS: Tiny right apical pneumothorax seen previously has decreased in the interval. Right pleural drain remains in place. Streaky density in the right mid lung is similar to prior. Left lung clear. Cardiopericardial silhouette is at upper limits of normal for size. No acute bony abnormality. Telemetry leads overlie the chest. IMPRESSION: Interval decrease in tiny right apical pneumothorax. Right pleural drain remains in place. Electronically Signed   By: Donnal Fusi M.D.   On: 04/23/2023 07:41    Assessment/Plan:  Kathryn Hart is here for polytrauma.  Has a  C1 lateral mass fracture which is currently nonsurgical and planning to treat in a collar as she would like to avoid an occipital cervical fusion if possible.  She also has a new diagnosis of a subacute compression fracture in thoracic spine.  Currently there is no retropulsion.  At this point it appears to be relatively asymptomatic.  She does have some bilateral rib pain which could potentially be radiculopathy from the T5 fracture causing T6 radiculopathy from foraminal height loss, could consider steroid administration for pain control or consider possible neuropathic pain medication such as gabapentin or Lyrica.  Will have her follow-up in clinic for the cervical spine, remain in the collar for at least 6 to 8 weeks, will plan for flexion-extension films at that point.  Carroll Clamp, MD/MSCR Department of Neurosurgery

## 2023-04-24 NOTE — Progress Notes (Signed)
 Progress Note   Patient: Kathryn Hart FIE:[id number] DOB: 09/21/1945 DOA: 04/21/2023     3 DOS: the patient was seen and examined on 04/24/2023   Brief hospital course:  78 y.o. female with past medical history of left-sided breast cancer status post left lumpectomy, hysterectomy, radiation therapy, usually in good state of health, being seen in consultation after presenting to the emergency room with neck pain following a fall down a flight of 12 steps.     States her toddler grandchild was climbing up the stairs and she was going up behind her, playing with her along the way and then lost her footing falling down the stairs.  She had immediate onset of neck pain and upper back pain.   Workup in the ED showed normal vitals, mild leukocytosis of 16,000, mild hyponatremia of 131 Pan trauma scan most significant for C1 fracture without acute vascular injury and rib fractures with moderate right hemothorax with partial lung collapse as further detailed below:     Assessment and Plan:  * Pneumothorax on right, moderate Unsuccessful attempts at pigtail catheter placement in the ED Status post chest tube insertion CT chest without contrast from 04/12 showed small-moderate anterior right pneumothorax, perhaps slightly decreased from previous. Small right pleural effusion. Consolidation/atelectasis of the right middle lobe, and posteriorly in the right upper and lower lobes. Stable patchy consolidations/atelectasis in the inferior lingula. Repeat chest x-ray 04/13 showed interval decrease in tiny right apical pneumothorax.  No residual pneumothorax demonstrated with right chest tube in place. Mild bibasilar atelectasis. Repeat CXR 04/14 shows no residual pneumothorax demonstrated with right chest tube in place. Mild bibasilar atelectasis. Right pleural drain remains in place Appreciate pulmonary input.  Chest tube may be Dc'd today       Multiple rib fractures involving four or  more ribs, right 5-9 with displaced 6th Incentive spirometry Pain control     C1 cervical fracture [facility]) Appreciate neurosurgery input Recommends C collar until follow-up as an outpatient       Fall down 12 stairs Polytrauma, suspecting accidental fall Low back pain Status post accidental fall.  Denies loss of consciousness Complains of back pain MRI of the lumbar spine shows  subtle acute compression fracture involving the inferior endplate of L4 without significant height loss or retropulsion. 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  Her daughter is also at the bedside.  Continues to complain of back pain.  Hiccups has improved  Physical Exam: Vitals:   04/24/23 0500 04/24/23 0553 04/24/23 0744 04/24/23 1116  BP:  (!) 158/74 111/60 (!) 101/50  Pulse:  71 67 66  Resp:  16 18 18   Temp:  98.2 F (36.8 C) 98.2 F (36.8 C) 98.2 F (36.8 C)  TempSrc:  Oral  Oral Oral  SpO2:  96% 96% 97%  Weight: 51.1 kg     Height:       Vitals and nursing note reviewed.  Constitutional:      General: She is not in acute distress.    Comments: Patient in cervical collar  HENT:     Head: Normocephalic and atraumatic.  Cardiovascular:     Rate and Rhythm: Normal rate and  regular rhythm.     Heart sounds: Normal heart sounds.  Pulmonary:     Effort: Pulmonary effort is normal.     Breath sounds: Normal breath sounds.  Abdominal:     Palpations: Abdomen is soft.     Tenderness: There is no abdominal tenderness.  Neurological:     Mental Status: Mental status is at baseline.     Data Reviewed: Sodium 121, chloride 90, hemoglobin 10.2 Labs reviewed  Family Communication: Plan of care discussed with patient and her daughter at the bedside.  All questions and concerns have been addressed.  They verbalized understanding and agree with the plan.  Patient's daughter requests subacute rehab upon discharge  Disposition: Status is: Inpatient Remains inpatient appropriate because: Has a chest tube in place  Planned Discharge Destination: Skilled nursing facility    Time spent: 38  minutes  Author: Read Camel, MD 04/24/2023 11:20 AM  For on call review [email or web address].

## 2023-04-24 NOTE — Consult Note (Signed)
 [facility] Consult Note:04/24/2023    Date of Admission:  04/21/2023           Reason for Consult:  hyponatremia   Referring Provider: Lucile Shutters, MD Primary Care Provider: Rayetta Humphrey, MD   History of Presenting Illness:  Kathryn Hart is a 78 y.o. female  medical history of left-sided breast cancer status post left lumpectomy, hysterectomy, radiation therapy, admitted to the hospital after a fall down a flight of 12 steps.  Patient was carrying her great grandchild who is okay.  She currently has a neck brace. She was found to have hyponatremia with sodium in the low 130s at admission.  During hospitalization sodium level has worsened and is down to 121 today.  Nephrology consult has been requested for evaluation. Workup so far includes urine sodium of 64, urine osmolality of 382, Serum osmolality 253, CT of the chest without contrast which shows small to moderate anterior right pneumothorax, consolidation of the right middle lobe and posteriorly in the right upper and lower lobe, stable patchy consolidations in the inferior lingula, minimally displaced fractures of the right ribs 5-9 and stable vertebral compression deformities of the 10 5, L1 and L2.  MRI of lumbar spine shows subtle acute compression fracture involving inferior endplate of L4.  Patient is getting consultation with neurosurgery.  Hospital course is also complicated by need for right chest tube placement.   Review of Systems: Review of Systems  Constitutional:  Negative for chills, fever, malaise/fatigue and weight loss.  HENT:  Negative for hearing loss.   Eyes:  Negative for blurred vision and double vision.  Respiratory:  Negative for cough and shortness of breath.   Cardiovascular:  Positive for chest pain. Negative for leg swelling.  Gastrointestinal:  Positive for nausea. Negative for blood in stool, heartburn and vomiting.  Genitourinary:  Negative for dysuria,  frequency and urgency.  Musculoskeletal:  Positive for back pain, falls, joint pain and neck pain.  Skin:  Negative for rash.  Neurological:  Negative for dizziness.  Endo/Heme/Allergies:  Does not bruise/bleed easily.  Psychiatric/Behavioral:  Negative for depression.     Past Medical History:  Diagnosis Date   Cancer (HCC) 05/2014   left sided breast cancer   COPD (chronic obstructive pulmonary disease) (HCC)    mild   Headache    Personal history of radiation therapy 2016   No Chemo   Wears partial dentures    top    Social History   Tobacco Use   Smoking status: Former    Current packs/day: 0.00    Average packs/day: 0.5 packs/day for 20.0 years (10.0 ttl pk-yrs)    Types: Cigarettes    Start date: 02/10/1998    Quit date: 02/10/2018    Years since quitting: 5.2   Smokeless tobacco: Never  Vaping Use   Vaping status: Never Used  Substance Use Topics   Alcohol use: Yes    Alcohol/week: 2.0 standard drinks of alcohol    Types: 2 Glasses of wine per week    Comment:      History reviewed. No pertinent family history.   OBJECTIVE: Blood pressure (!) 101/50, pulse 66, temperature 98.2 F (36.8 C), temperature source Oral, resp. rate 18, height 5\' 2"  (1.575 m), weight 51.1 kg, SpO2 97%.  Physical Exam General appearance-frail, elderly lady, laying in the bed HEENT-moist oral mucous membranes Pulmonary-right chest tube in place Cardiac-irregular rhythm, normal rate Abdomen-soft, nontender, nondistended Extremities-no peripheral edema Neuro-alert and oriented  Lab Results Lab Results  Component Value Date   WBC 3.8 (L) 04/24/2023   HGB 10.2 (L) 04/24/2023   HCT 29.0 (L) 04/24/2023   MCV 83.8 04/24/2023   PLT 242 04/24/2023    Lab Results  Component Value Date   CREATININE 0.66 04/24/2023   BUN 7 (L) 04/24/2023   NA 121 (L) 04/24/2023   K 4.1 04/24/2023   CL 90 (L) 04/24/2023   CO2 25 04/24/2023    Lab Results  Component Value Date   ALT 17  09/29/2016   AST 23 09/29/2016   ALKPHOS 54 09/29/2016   BILITOT 1.2 09/29/2016     Microbiology: Recent Results (from the past 240 hours)  MRSA Next Gen by PCR, Nasal     Status: None   Collection Time: 04/22/23  1:38 AM   Specimen: Nasal Mucosa; Nasal Swab  Result Value Ref Range Status   MRSA by PCR Next Gen NOT DETECTED NOT DETECTED Final    Comment: (NOTE) The GeneXpert MRSA Assay (FDA approved for NASAL specimens only), is one component of a comprehensive MRSA colonization surveillance program. It is not intended to diagnose MRSA infection nor to guide or monitor treatment for MRSA infections. Test performance is not FDA approved in patients less than 15 years old. Performed at [facility], [address] [place], [place] [address]     Medications: Scheduled Meds:  buPROPion  150 mg Oral BID   calcium carbonate  1 tablet Oral BID WC   Chlorhexidine Gluconate Cloth  6 each Topical Daily   enoxaparin (LOVENOX) injection  40 mg Subcutaneous Q24H   famotidine  20 mg Oral BID   feeding supplement  237 mL Oral BID BM   polyethylene glycol  17 g Oral Daily   senna-docusate  2 tablet Oral QHS   traZODone  100 mg Oral QHS   Continuous Infusions:  chlorproMAZINE (THORAZINE) 12.5 mg in sodium chloride 0.9 % 25 mL IVPB 12.5 mg (04/23/23 2045)   PRN Meds:.acetaminophen, alum & mag hydroxide-simeth, chlorproMAZINE (THORAZINE) 12.5 mg in sodium chloride 0.9 % 25 mL IVPB, docusate sodium, guaiFENesin-codeine, HYDROmorphone (DILAUDID) injection, naLOXone (NARCAN)  injection, ondansetron (ZOFRAN) IV, mouth rinse, oxyCODONE, polyethylene glycol, tiZANidine  No Known Allergies  Urinalysis: No results for input(s): "COLORURINE", "LABSPEC", "PHURINE", "GLUCOSEU", "HGBUR", "BILIRUBINUR", "KETONESUR", "PROTEINUR", "UROBILINOGEN", "NITRITE", "LEUKOCYTESUR" in the last 72 hours.  Invalid input(s): "APPERANCEUR"    Imaging: DG Chest Port 1 View Result Date:  04/24/2023 CLINICAL DATA:  Follow up pneumothorax. EXAM: PORTABLE CHEST 1 VIEW COMPARISON:  Radiographs 04/23/2023 and 04/22/2023.  CT 04/22/2023. FINDINGS: 0756 hours. Small caliber pigtail right chest tube is unchanged in position. No residual pneumothorax demonstrated. There is mild atelectasis at both lung bases. The heart size and mediastinal contours are stable. No significant chest wall emphysema identified. The bones appear unchanged. IMPRESSION: No residual pneumothorax demonstrated with right chest tube in place. Mild bibasilar atelectasis. Electronically Signed   By: Elmon Hagedorn M.D.   On: 04/24/2023 08:28   MR LUMBAR SPINE WO CONTRAST Result Date: 04/23/2023 CLINICAL DATA:  Initial evaluation for acute trauma, lower back pain. EXAM: MRI LUMBAR SPINE WITHOUT CONTRAST MRI SACRUM WITHOUT CONTRAST TECHNIQUE: Multiplanar, multisequence MR imaging of the lumbar spine and sacrum was performed. No intravenous contrast was administered. COMPARISON:  Comparison made with prior CT from 04/21/2023 and MRI from 09/16/2021. FINDINGS: MRI LUMBAR SPINE FINDINGS Segmentation: Standard. Lowest well-formed disc space labeled the L5-S1 level. Alignment: 5 mm anterolisthesis of L4 on L5, with  3 mm anterolisthesis of L5 on S1. Findings chronic and facet mediated. Underlying mild levoscoliosis. Vertebrae: Subtle acute compression fracture with marrow edema seen involving the inferior endplate of L4 without significant height loss or retropulsion (series 3, image 7). No other acute or recent fracture within the lumbar spine. Chronic compression deformity of L1 with up to 80% central height loss and 6 mm bony retropulsion. Additional mild chronic compression deformity of L2 with mild 25% height loss and no more than trace bony retropulsion. Vertebral body height otherwise maintained. Bone marrow signal intensity within normal limits. No worrisome osseous lesions. No other abnormal marrow edema. Conus medullaris and cauda  equina: Conus extends to the L1-2 level. Conus and cauda equina appear normal. Paraspinal and other soft tissues: Paraspinous soft tissues within normal limits. Disc levels: T12-L1: Disc desiccation with mild disc bulge. 6 mm bony retropulsion related to the chronic L1 fracture. Mild spinal stenosis. Mild left L1 foraminal narrowing. Right neural foramen remains patent. L1-2: Disc desiccation without disc bulge. No canal or foraminal stenosis. L2-3: Disc desiccation without disc bulge. Trace bony retropulsion related to the L2 fracture. Mild facet hypertrophy. Resultant mild left with borderline right lateral recess stenosis. Central canal remains patent. Mild to moderate bilateral L2 foraminal stenosis. L3-4: Minimal disc bulge. Mild facet and ligament flavum hypertrophy. No significant spinal stenosis. Foramina remain patent. L4-5: Anterolisthesis. Disc desiccation with mild disc bulge. Moderate facet and ligament flavum hypertrophy. No significant spinal stenosis. Mild bilateral L4 foraminal narrowing. L5-S1: Trace anterolisthesis. Disc desiccation with mild disc bulge. Reactive endplate spurring on the right. Mild facet hypertrophy. No spinal stenosis. Foramina remain patent. MRI SACRUM FINDINGS Sacrum intact without acute or recent fracture. SI joints symmetric and within normal limits. No evidence for acute or chronic sacroiliitis. Visualized pelvis intact. Bone marrow signal intensity within normal limits. No worrisome osseous lesions. Symmetric edema noted within the gluteus minimus musculature bilaterally, nonspecific, but possibly related to overall volume status. Visualized musculature otherwise unremarkable. Visualized visceral structures within normal limits. Normal vascular flow void seen within the visualized pelvis. No visible adenopathy. IMPRESSION: MRI LUMBAR SPINE: 1. Subtle acute compression fracture involving the inferior endplate of L4 without significant height loss or retropulsion. 2. Chronic  compression deformities of L1 and L2 with up to 80% height loss at L1 and 25% height loss at L2. 3. Underlying multilevel lumbar spondylosis and facet hypertrophy as above. No high-grade spinal stenosis. Mild to moderate bilateral L2 foraminal narrowing, with mild bilateral L4 foraminal stenosis. MRI SACRUM: Negative MRI of the sacrum.  No acute fracture or other abnormality. Electronically Signed   By: Virgia Griffins M.D.   On: 04/23/2023 20:41   MR SACRUM SI JOINTS WO CONTRAST Result Date: 04/23/2023 CLINICAL DATA:  Initial evaluation for acute trauma, lower back pain. EXAM: MRI LUMBAR SPINE WITHOUT CONTRAST MRI SACRUM WITHOUT CONTRAST TECHNIQUE: Multiplanar, multisequence MR imaging of the lumbar spine and sacrum was performed. No intravenous contrast was administered. COMPARISON:  Comparison made with prior CT from 04/21/2023 and MRI from 09/16/2021. FINDINGS: MRI LUMBAR SPINE FINDINGS Segmentation: Standard. Lowest well-formed disc space labeled the L5-S1 level. Alignment: 5 mm anterolisthesis of L4 on L5, with 3 mm anterolisthesis of L5 on S1. Findings chronic and facet mediated. Underlying mild levoscoliosis. Vertebrae: Subtle acute compression fracture with marrow edema seen involving the inferior endplate of L4 without significant height loss or retropulsion (series 3, image 7). No other acute or recent fracture within the lumbar spine. Chronic compression deformity of L1 with up  to 80% central height loss and 6 mm bony retropulsion. Additional mild chronic compression deformity of L2 with mild 25% height loss and no more than trace bony retropulsion. Vertebral body height otherwise maintained. Bone marrow signal intensity within normal limits. No worrisome osseous lesions. No other abnormal marrow edema. Conus medullaris and cauda equina: Conus extends to the L1-2 level. Conus and cauda equina appear normal. Paraspinal and other soft tissues: Paraspinous soft tissues within normal limits. Disc  levels: T12-L1: Disc desiccation with mild disc bulge. 6 mm bony retropulsion related to the chronic L1 fracture. Mild spinal stenosis. Mild left L1 foraminal narrowing. Right neural foramen remains patent. L1-2: Disc desiccation without disc bulge. No canal or foraminal stenosis. L2-3: Disc desiccation without disc bulge. Trace bony retropulsion related to the L2 fracture. Mild facet hypertrophy. Resultant mild left with borderline right lateral recess stenosis. Central canal remains patent. Mild to moderate bilateral L2 foraminal stenosis. L3-4: Minimal disc bulge. Mild facet and ligament flavum hypertrophy. No significant spinal stenosis. Foramina remain patent. L4-5: Anterolisthesis. Disc desiccation with mild disc bulge. Moderate facet and ligament flavum hypertrophy. No significant spinal stenosis. Mild bilateral L4 foraminal narrowing. L5-S1: Trace anterolisthesis. Disc desiccation with mild disc bulge. Reactive endplate spurring on the right. Mild facet hypertrophy. No spinal stenosis. Foramina remain patent. MRI SACRUM FINDINGS Sacrum intact without acute or recent fracture. SI joints symmetric and within normal limits. No evidence for acute or chronic sacroiliitis. Visualized pelvis intact. Bone marrow signal intensity within normal limits. No worrisome osseous lesions. Symmetric edema noted within the gluteus minimus musculature bilaterally, nonspecific, but possibly related to overall volume status. Visualized musculature otherwise unremarkable. Visualized visceral structures within normal limits. Normal vascular flow void seen within the visualized pelvis. No visible adenopathy. IMPRESSION: MRI LUMBAR SPINE: 1. Subtle acute compression fracture involving the inferior endplate of L4 without significant height loss or retropulsion. 2. 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 3. Underlying multilevel lumbar spondylosis and facet hypertrophy as  above. No high-grade spinal stenosis. Mild to moderate bilateral L2 foraminal narrowing, with mild bilateral L4 foraminal stenosis. MRI SACRUM: Negative MRI of the sacrum.  No acute fracture or other abnormality. Electronically Signed   By: Virgia Griffins M.D.   On: 04/23/2023 20:41   MR THORACIC SPINE WO CONTRAST Result Date: 04/23/2023 CLINICAL DATA:  Initial evaluation for acute mid back pain, fractures. EXAM: MRI THORACIC SPINE WITHOUT CONTRAST TECHNIQUE: Multiplanar, multisequence MR imaging of the thoracic spine was performed. No intravenous contrast was administered. COMPARISON:  Comparison made with prior CT from 04/21/2023 FINDINGS: Alignment: Exaggeration of the normal thoracic kyphosis. Trace degenerative anterolisthesis of T2 on T3. Vertebrae: Compression fracture involving the T5 vertebral body with up to 25% height loss without bony retropulsion demonstrates persistent marrow edema, consistent with a probable evolving subacute fracture. Otherwise, vertebral body height maintained within the thoracic spine. No other acute or recent fracture. Bone marrow signal intensity within normal limits. Few small benign hemangiomata noted. No worrisome osseous lesions. No abnormal marrow edema. Note is made of a chronic L1 compression fracture as well, described on corresponding MRI of the lumbar spine. Cord:  Normal signal and morphology. Paraspinal and other soft tissues: Paraspinous soft tissues demonstrate no acute finding. Small layering bilateral pleural effusions, right larger than left. Disc levels: Ordinary for age multilevel disc desiccation with mild noncompressive disc bulging noted within the thoracic spine. No significant spinal stenosis. Foramina remain patent.  IMPRESSION: 1. Subacute compression fracture involving the T5 vertebral body with up to 25% height loss without bony retropulsion. 2. No other acute osseous abnormality within the thoracic spine. 3. Small layering bilateral pleural  effusions, right larger than left. Electronically Signed   By: Virgia Griffins M.D.   On: 04/23/2023 20:14   DG Chest Port 1 View Result Date: 04/23/2023 CLINICAL DATA:  Right-sided pneumothorax. EXAM: PORTABLE CHEST 1 VIEW COMPARISON:  04/22/2023 FINDINGS: Tiny right apical pneumothorax seen previously has decreased in the interval. Right pleural drain remains in place. Streaky density in the right mid lung is similar to prior. Left lung clear. Cardiopericardial silhouette is at upper limits of normal for size. No acute bony abnormality. Telemetry leads overlie the chest. IMPRESSION: Interval decrease in tiny right apical pneumothorax. Right pleural drain remains in place. Electronically Signed   By: Donnal Fusi M.D.   On: 04/23/2023 07:41      Assessment/Plan:  Myrakle Wingler is a 78 y.o. female with medical problems of history of left breast cancer status post lumpectomy, radiation therapy, hysterectomy   was admitted on 04/21/2023 for :  Pneumothorax on right [J93.9] Fall, initial encounter [W19.XXXA] Closed fracture of multiple ribs of right side, initial encounter [S22.41XA] Pneumothorax, unspecified type [J93.9] Closed displaced fracture of first cervical vertebra, unspecified fracture morphology, initial encounter (HCC) [S12.000A]  Hyponatremia Worsened during hospitalization.  Patient was first noted to have mild hyponatremia in September 2024 in January 2025.  Sodium level of 133-134 was noted.  Most recently on 04/18/2023, her sodium was 132. Progressively during hospitalization it has worsened. No diuretic.  Thyroid function last year was normal. Chest CT is Abnormal and does demonstrate consolidation as noted above. Patient reports her appetite is poor and she has been drinking good amount of water.  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  Plan: Recommended to patient to try to drink high-protein drinks such as  Ensure twice a day. Follow-up fluid and fluid restriction to about 32 ounces per day Pain control as per primary team. Will obtain TSH, SPEP, free Lambda ratio with next set of labs  Kathryn Hart 04/24/23

## 2023-04-24 NOTE — Care Management Important Message (Signed)
 Important Message  Patient Details  Name: Kathryn Hart MRN: [id number] Date of Birth: 26-Sep-1945   Important Message Given:  Yes - Medicare IM     Arianny Pun W, CMA 04/24/2023, 11:19 AM

## 2023-04-24 NOTE — Procedures (Signed)
 Chest Tube Insertion: Indication:pneumothorax RT side Consent:verbal/written  PROCEDURE DATE 4/12 2025   Risks and benefits explained in detail including risk of infection, bleeding, respiratory failure and death..   Hand washing performed prior to starting the procedure.   Type of Anesthesia: 1 % Lidocaine.   Procedure: An active timeout was performed and correct patient, name, & ID confirmed.  After explaining risks and benefits, patient positioned correctly for chest tube placement. Patient was prepped in a sterile fashion including chlorohexadine preps, sterile drape, sterile gown and sterile gloves. Introducer needle was placed and guide wire was inserted. Using Seldinger Technique, the introducer needle was removed and three dilators were used to create an opening for insertion of chest tube. Chest tube sutured in place and proper dressing used..   Findings: Gush if air heard upon insetion of needle, Chest tube placed bewteen 5-7 ribs midaxiilary line.   Chest tube connected to: pleurovac .   Number of Attempts:1  Complications:none  Estimated Blood Loss:5 cc  Chest radiograph indicated and ordered.   Operator: Laren Player, M.D.  [facility]  Medical Director [facility] Medical Director [facility] Cardio-Pulmonary Department

## 2023-04-24 NOTE — Progress Notes (Signed)
 Right Chest tube removed. Repeat CXR at 21:00.   Annitta Kindler, MD [facility] 04/24/2023 3:49 PM

## 2023-04-25 ENCOUNTER — Inpatient Hospital Stay

## 2023-04-25 DIAGNOSIS — J939 Pneumothorax, unspecified: Secondary | ICD-10-CM | POA: Diagnosis not present

## 2023-04-25 LAB — RENAL FUNCTION PANEL
Albumin: 3.2 g/dL — ABNORMAL LOW (ref 3.5–5.0)
Anion gap: 6 (ref 5–15)
BUN: 8 mg/dL (ref 8–23)
CO2: 27 mmol/L (ref 22–32)
Calcium: 8.4 mg/dL — ABNORMAL LOW (ref 8.9–10.3)
Chloride: 94 mmol/L — ABNORMAL LOW (ref 98–111)
Creatinine, Ser: 0.55 mg/dL (ref 0.44–1.00)
GFR, Estimated: >60 mL/min (ref >60)
Glucose, Bld: 116 mg/dL — ABNORMAL HIGH (ref 70–99)
Phosphorus: 3.3 mg/dL (ref 2.5–4.6)
Potassium: 4.6 mmol/L (ref 3.5–5.1)
Sodium: 127 mmol/L — ABNORMAL LOW (ref 135–145)

## 2023-04-25 LAB — TSH: TSH: 6.712 u[IU]/mL — ABNORMAL HIGH (ref 0.350–4.500)

## 2023-04-25 MED ORDER — MELATONIN 5 MG PO TABS
2.5000 mg | ORAL_TABLET | Freq: Every day | ORAL | Status: DC
  Administered 2023-04-25: 2.5 mg via ORAL
  Filled 2023-04-25: qty 1

## 2023-04-25 MED ORDER — OXYCODONE HCL 5 MG PO TABS
5.0000 mg | ORAL_TABLET | Freq: Once | ORAL | Status: AC
  Administered 2023-04-25: 5 mg via ORAL

## 2023-04-25 MED ORDER — GABAPENTIN 100 MG PO CAPS
100.0000 mg | ORAL_CAPSULE | Freq: Two times a day (BID) | ORAL | Status: DC
  Administered 2023-04-25 – 2023-04-26 (×3): 100 mg via ORAL
  Filled 2023-04-25 (×3): qty 1

## 2023-04-25 NOTE — Progress Notes (Signed)
 Progress Note   Patient: Kathryn Hart WUJ:[id number] DOB: 10-10-1945 DOA: 04/21/2023     4 DOS: the patient was seen and examined on 04/25/2023   Brief hospital course:  78 y.o. female with past medical history of left-sided breast cancer status post left lumpectomy, hysterectomy, radiation therapy, usually in good state of health, being seen in consultation after presenting to the emergency room with neck pain following a fall down a flight of 12 steps.     States her toddler grandchild was climbing up the stairs and she was going up behind her, playing with her along the way and then lost her footing falling down the stairs.  She had immediate onset of neck pain and upper back pain.   Workup in the ED showed normal vitals, mild leukocytosis of 16,000, mild hyponatremia of 131 Pan trauma scan most significant for C1 fracture without acute vascular injury and rib fractures with moderate right hemothorax with partial lung collapse as further detailed below:    Assessment and Plan:  * Pneumothorax on right, moderate Unsuccessful attempts at pigtail catheter placement in the ED Status post chest tube insertion CT chest without contrast from 04/12 showed small-moderate anterior right pneumothorax, perhaps slightly decreased from previous. Small right pleural effusion. Consolidation/atelectasis of the right middle lobe, and posteriorly in the right upper and lower lobes. Stable patchy consolidations/atelectasis in the inferior lingula.  04/13 Repeat chest x-ray 04/13 showed interval decrease in tiny right apical pneumothorax. No residual pneumothorax demonstrated with right chest tube in place. Mild bibasilar atelectasis.  04/14 Repeat CXR 04/14 shows no residual pneumothorax demonstrated with right chest tube in place. Mild bibasilar atelectasis.  Chest tube has been removed       Multiple rib fractures involving four or more ribs, right 5-9 with displaced 6th Continue incentive  spirometry Pain control     C1 cervical fracture (HCC) Appreciate neurosurgery input Recommends C collar until follow-up as an outpatient       Fall down 12 stairs Polytrauma, suspecting accidental fall Low back pain Status post accidental fall.  Denies loss of consciousness Complains of back pain MRI of the lumbar spine shows subtle acute compression fracture involving the inferior endplate of L4 without significant height loss or retropulsion. 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  Feels better.  Requesting for medication for insomnia  Physical Exam: Vitals:   04/25/23 0024 04/25/23 0300 04/25/23 0527 04/25/23 0731  BP: (!) 141/91  115/62 (!) 145/74  Pulse: 86  79 86  Resp: 18  18 16   Temp: 98.2 F (36.8 C)  98.1 F (36.7 C) 98.1 F (36.7 C)  TempSrc:      SpO2: 97%  95% 96%  Weight:  48.4 kg    Height:  Vitals and nursing note reviewed.  Constitutional:      General: She is not in acute distress.    Comments: Patient in cervical collar  HENT:     Head: Normocephalic and atraumatic.  Cardiovascular:     Rate and Rhythm: Normal rate and regular rhythm.     Heart sounds: Normal heart sounds.  Pulmonary:      Effort: Pulmonary effort is normal.     Breath sounds: Normal breath sounds.  Abdominal:     Palpations: Abdomen is soft.     Tenderness: There is no abdominal tenderness.  Neurological:     Mental Status: Mental status is at baseline.  Data Reviewed: Sodium 127, chloride 94, TSH 6.7 There are no new results to review at this time.  Family Communication: Plan of care discussed with patient and her son at the bedside.  All questions and concerns have been addressed.  Disposition: Status is: Inpatient Remains inpatient appropriate because: Needs PT evaluation  Planned Discharge Destination: Skilled nursing facility    Time spent: 36 minutes  Author: Read Camel, MD 04/25/2023 11:15 AM  For on call review [email or web address].

## 2023-04-25 NOTE — Evaluation (Signed)
 Physical Therapy Evaluation Patient Details Name: Kathryn Hart MRN: [id number] DOB: [birth year] Today's Date: 04/25/2023  History of Present Illness  Pt is a 45 female that presented for a fall down 12 steps. Workup showed pnuemothorax, a C1 lateral mass fracture (nonsurgical, C-collar recommended) as well as a new diagnosis of a thoracic compression fracture, rib fx 5-9. PMH of f left-sided breast cancer status post left lumpectomy, hysterectomy, radiation therapy.  Clinical Impression  Pt A&Ox4, reported no neck pain, referenced R sided shoulder/side pain. At baseline the pt is independent. She was able to perform bed mobility modI. Supervision for transfers and ambulation, no AD. Pt also able to perform stair navigation with CGA-minA. Did experience 1 LOB on first step, able to correct with minA and  use of rail. Pt left with OT in hallway after stairs.  Overall the patient demonstrated deficits (see "PT Problem List") that impede the patient's functional abilities, safety, and mobility and would benefit from skilled PT intervention.           If plan is discharge home, recommend the following: A little help with bathing/dressing/bathroom;Assistance with cooking/housework;Help with stairs or ramp for entrance   Can travel by private vehicle        Equipment Recommendations None recommended by PT  Recommendations for Other Services       Functional Status Assessment Patient has had a recent decline in their functional status and demonstrates the ability to make significant improvements in function in a reasonable and predictable amount of time.     Precautions / Restrictions Precautions Precautions: Fall Recall of Precautions/Restrictions: Intact Restrictions Weight Bearing Restrictions Per Provider Order: No      Mobility  Bed Mobility Overal bed mobility: Modified Independent                  Transfers Overall transfer level: Needs assistance    Transfers: Sit to/from Stand Sit to Stand: Supervision                Ambulation/Gait Ambulation/Gait assistance: Supervision Gait Distance (Feet): 200 Feet Assistive device: None            Stairs Stairs: Yes Stairs assistance: Contact guard assist, Min assist Stair Management: One rail Right, One rail Left, Step to pattern Number of Stairs: 6 General stair comments: 1 LOB at bottom of stair, able to correct with minA and rail  Wheelchair Mobility     Tilt Bed    Modified Rankin (Stroke Patients Only)       Balance Overall balance assessment: Needs assistance Sitting-balance support: Feet supported Sitting balance-Leahy Scale: Good     Standing balance support: No upper extremity supported Standing balance-Leahy Scale: Good                               Pertinent Vitals/Pain Pain Assessment Pain Assessment: Faces Faces Pain Scale: No hurt    Home Living Family/patient expects to be discharged to:: Private residence Living Arrangements: Children Available Help at Discharge: Family Type of Home: House Home Access: Stairs to enter   Secretary/administrator of Steps: 2 Alternate Level Stairs-Number of Steps: 12 Home Layout: Two level;Able to live on main level with bedroom/bathroom        Prior Function Prior Level of Function : Independent/Modified Independent;Driving                     Extremity/Trunk Assessment  Lower Extremity Assessment Lower Extremity Assessment: Overall WFL for tasks assessed       Communication        Cognition Arousal: Alert Behavior During Therapy: WFL for tasks assessed/performed   PT - Cognitive impairments: No apparent impairments                         Following commands: Intact       Cueing Cueing Techniques: Verbal cues, Gestural cues, Tactile cues     General Comments      Exercises     Assessment/Plan    PT Assessment Patient needs continued PT  services  PT Problem List Decreased strength;Decreased activity tolerance;Decreased balance;Decreased mobility       PT Treatment Interventions Neuromuscular re-education;DME instruction;Gait training;Stair training;Patient/family education;Functional mobility training;Therapeutic activities;Therapeutic exercise;Balance training    PT Goals (Current goals can be found in the Care Plan section)  Acute Rehab PT Goals Patient Stated Goal: to go home Time For Goal Achievement: 05/09/23 Potential to Achieve Goals: Good    Frequency Min 3X/week     Co-evaluation               AM-PAC PT "6 Clicks" Mobility  Outcome Measure Help needed turning from your back to your side while in a flat bed without using bedrails?: None Help needed moving from lying on your back to sitting on the side of a flat bed without using bedrails?: None Help needed moving to and from a bed to a chair (including a wheelchair)?: None Help needed standing up from a chair using your arms (e.g., wheelchair or bedside chair)?: None Help needed to walk in hospital room?: None Help needed climbing 3-5 steps with a railing? : A Little 6 Click Score: 23    End of Session   Activity Tolerance: Patient tolerated treatment well Patient left: Other (comment) (with OT in hallway) Nurse Communication: Mobility status PT Visit Diagnosis: Difficulty in walking, not elsewhere classified (R26.2);Muscle weakness (generalized) (M62.81);Other abnormalities of gait and mobility (R26.89)    Time: 1400-1411 PT Time Calculation (min) (ACUTE ONLY): 11 min   Charges:   PT Evaluation $PT Eval Low Complexity: 1 Low   PT General Charges $$ ACUTE PT VISIT: 1 Visit         Darien Eden PT, DPT 3:46 PM,04/25/23

## 2023-04-25 NOTE — Plan of Care (Signed)
  Problem: Health Behavior/Discharge Planning: Goal: Ability to manage health-related needs will improve Outcome: Progressing   Problem: Clinical Measurements: Goal: Will remain free from infection Outcome: Progressing Goal: Diagnostic test results will improve Outcome: Progressing Goal: Cardiovascular complication will be avoided Outcome: Progressing   Problem: Activity: Goal: Risk for activity intolerance will decrease Outcome: Progressing   Problem: Nutrition: Goal: Adequate nutrition will be maintained Outcome: Progressing   Problem: Pain Managment: Goal: General experience of comfort will improve and/or be controlled Outcome: Progressing   Problem: Safety: Goal: Ability to remain free from injury will improve Outcome: Progressing   Problem: Skin Integrity: Goal: Risk for impaired skin integrity will decrease Outcome: Progressing

## 2023-04-25 NOTE — Evaluation (Signed)
 Occupational Therapy Evaluation Patient Details Name: Kathryn Hart MRN: [id number] DOB: 1945/05/11 Today's Date: 04/25/2023   History of Present Illness   Pt is a 75 female that presented for a fall down 12 steps. Workup showed pnuemothorax, a C1 lateral mass fracture (nonsurgical, C-collar recommended) as well as a new diagnosis of a thoracic compression fracture, rib fx 5-9. PMH of f left-sided breast cancer status post left lumpectomy, hysterectomy, radiation therapy.     Clinical Impressions PTA, pt independent including driving and caring for great granddaughter. [place] cervical collar donned throughout (messaged MD regarding clarification on wear schedule). A&Ox4. Pt performs bed mobility mod independent, functional mobility in hallway on unit with CGA fading to supervision, no AD. Educated on recommendations for safe ADL performance given that pt lives in a multi-level home. Pt able to simulate LB/UB bathing from a seated position without LOB. Pt demonstrates mild balance and strength deficits that require skilled OT intervention to decrease risk of falls and caregiver burden. OT will continue to follow for functional gains.      If plan is discharge home, recommend the following:   A little help with walking and/or transfers;A little help with bathing/dressing/bathroom;Assist for transportation;Help with stairs or ramp for entrance     Functional Status Assessment   Patient has had a recent decline in their functional status and demonstrates the ability to make significant improvements in function in a reasonable and predictable amount of time.     Equipment Recommendations   BSC/3in1      Precautions/Restrictions   Precautions Precautions: Fall Recall of Precautions/Restrictions: Intact Required Braces or Orthoses: Cervical Brace Cervical Brace: Hard collar;At all times Restrictions Weight Bearing Restrictions Per Provider Order: No     Mobility  Bed Mobility Overal bed mobility: Modified Independent                  Transfers Overall transfer level: Needs assistance Equipment used: None Transfers: Sit to/from Stand, Bed to chair/wheelchair/BSC Sit to Stand: Supervision     Step pivot transfers: Supervision            Balance Overall balance assessment: Needs assistance Sitting-balance support: Feet supported Sitting balance-Leahy Scale: Good     Standing balance support: No upper extremity supported Standing balance-Leahy Scale: Good                             ADL either performed or assessed with clinical judgement   ADL Overall ADL's : Needs assistance/impaired Eating/Feeding: Independent   Grooming: Sitting;Independent                   Toilet Transfer: Civil engineer, contracting mobility during ADLs: Contact guard assist General ADL Comments: anticipate up to CGA for ADL performance, will need edu and assist for donning/doffing cervical collar supine once clarification from MD recieved     Vision Baseline Vision/History: 1 Wears glasses Ability to See in Adequate Light: 0 Adequate Patient Visual Report: No change from baseline              Pertinent Vitals/Pain Pain Assessment Pain Assessment: Faces Faces Pain Scale: Hurts a little bit Pain Location: R shoulder Pain Descriptors / Indicators: Discomfort, Dull Pain Intervention(s): RN gave pain meds during session, Limited activity within patient's tolerance     Extremity/Trunk Assessment Upper Extremity Assessment Upper Extremity Assessment: Overall WFL for tasks assessed  Lower Extremity Assessment Lower Extremity Assessment: Defer to PT evaluation   Cervical / Trunk Assessment Cervical / Trunk Assessment: Other exceptions (Cervical collar)   Communication Communication Communication: No apparent difficulties   Cognition Arousal: Alert Behavior During Therapy:  WFL for tasks assessed/performed Cognition: No apparent impairments                               Following commands: Intact       Cueing  General Comments   Cueing Techniques: Verbal cues;Gestural cues;Tactile cues  Cervical collar donned           Home Living Family/patient expects to be discharged to:: Private residence Living Arrangements: Children Available Help at Discharge: Family Type of Home: House Home Access: Stairs to enter [employer] of Steps: 2   Home Layout: Two level;Able to live on main level with bedroom/bathroom Alternate Level Stairs-Number of Steps: 12 Alternate Level Stairs-Rails: Left;Right Bathroom Shower/Tub: Producer, television/film/video: Standard     Home Equipment: Grab bars - tub/shower          Prior Functioning/Environment Prior Level of Function : Independent/Modified Independent;Driving                    OT Problem List: Decreased strength;Decreased range of motion;Impaired balance (sitting and/or standing);Decreased knowledge of precautions;Decreased knowledge of use of DME or AE   OT Treatment/Interventions: Self-care/ADL training;Therapeutic exercise;Neuromuscular education;DME and/or AE instruction;Therapeutic activities;Patient/family education;Balance training      OT Goals(Current goals can be found in the care plan section)   Acute Rehab OT Goals OT Goal Formulation: With patient Time For Goal Achievement: 05/09/23 Potential to Achieve Goals: Good   OT Frequency:  Min 2X/week    Co-evaluation PT/OT/SLP Co-Evaluation/Treatment: Yes Reason for Co-Treatment: Complexity of the patient's impairments (multi-system involvement);For patient/therapist safety;To address functional/ADL transfers PT goals addressed during session: Mobility/safety with mobility;Balance OT goals addressed during session: ADL's and self-care      AM-PAC OT "6 Clicks" Daily Activity     Outcome Measure  Help from another person eating meals?: None Help from another person taking care of personal grooming?: None Help from another person toileting, which includes using toliet, bedpan, or urinal?: A Little Help from another person bathing (including washing, rinsing, drying)?: A Little Help from another person to put on and taking off regular upper body clothing?: A Little Help from another person to put on and taking off regular lower body clothing?: A Little 6 Click Score: 20   End of Session Equipment Utilized During Treatment: Cervical collar Nurse Communication: Mobility status  Activity Tolerance: Patient tolerated treatment well Patient left: in chair;with call bell/phone within reach;with nursing/sitter in room;with chair alarm set;with family/visitor present (XRAY tech in room)  OT Visit Diagnosis: History of falling (Z91.81);Other abnormalities of gait and mobility (R26.89)                Time: 1400-1425 OT Time Calculation (min): 25 min Charges:  OT General Charges $OT Visit: 1 Visit OT Evaluation $OT Eval Low Complexity: 1 Low  Katerra Ingman L. Duaa Stelzner, OTR/L  04/25/23, 4:37 PM

## 2023-04-25 NOTE — Progress Notes (Signed)
 [facility], [place] 04/25/23  Subjective:   LOS: 4 04/14 0701 - 04/15 0700 In: 840 [P.O.:840] Out: -   Overall patient feels better today.  Able to eat some more.  Tolerating half of protein supplement drink. Sodium improved to 127 today.  Objective:  Vital signs in last 24 hours:  Temp:  [97.7 F (36.5 C)-98.2 F (36.8 C)] 98.2 F (36.8 C) (04/15 1419) Pulse Rate:  [79-98] 92 (04/15 1419) Resp:  [16-18] 16 (04/15 1419) BP: (112-145)/(62-95) 136/79 (04/15 1419) SpO2:  [94 %-97 %] 94 % (04/15 1419) Weight:  [48.4 kg] 48.4 kg (04/15 0300)  Weight change: -2.7 kg Filed Weights   04/23/23 0500 04/24/23 0500 04/25/23 0300  Weight: 49.8 kg 51.1 kg 48.4 kg    Intake/Output:    Intake/Output Summary (Last 24 hours) at 04/25/2023 1617 Last data filed at 04/25/2023 1500 Gross per 24 hour  Intake 960 ml  Output --  Net 960 ml     Physical Exam: General: No acute distress, laying in the bed  HEENT Moist oral mucous membranes  Pulm/lungs Normal breathing effort  CVS/Heart Normal rate  Abdomen:  Soft, nontender, nondistended  Extremities: No peripheral edema  Neurologic: Alert, oriented, able to answer questions appropriately  Skin: No acute rashes   Neck brace in place       Basic Metabolic Panel:  Recent Labs  Lab 04/21/23 1505 04/22/23 0311 04/23/23 0454 04/23/23 0850 04/24/23 0850 04/25/23 0351  NA 134* 131* 123* 122* 121* 127*  K 4.3 4.0 3.9  --  4.1 4.6  CL 99 96* 90*  --  90* 94*  CO2 28 29 28   --  25 27  GLUCOSE 164* 135* 97  --  108* 116*  BUN 8 7* 6*  --  7* 8  CREATININE 0.75 0.57 0.63  --  0.66 0.55  CALCIUM 8.8* 8.5* 8.4*  --  7.7* 8.4*  MG  --   --  2.1  --   --   --   PHOS  --   --  2.6  --   --  3.3     CBC: Recent Labs  Lab 04/21/23 1505 04/23/23 0454 04/24/23 0850  WBC 16.1* 5.7 3.8*  NEUTROABS 15.1*  --   --   HGB 12.4 11.8* 10.2*  HCT 36.2 33.3* 29.0*  MCV 90.3 85.4 83.8  PLT 389 359 242      No results found for: "HEPBSAG", "HEPBSAB", "HEPBIGM"    Microbiology:  Recent Results (from the past 240 hours)  MRSA Next Gen by PCR, Nasal     Status: None   Collection Time: 04/22/23  1:38 AM   Specimen: Nasal Mucosa; Nasal Swab  Result Value Ref Range Status   MRSA by PCR Next Gen NOT DETECTED NOT DETECTED Final    Comment: (NOTE) The GeneXpert MRSA Assay (FDA approved for NASAL specimens only), is one component of a comprehensive MRSA colonization surveillance program. It is not intended to diagnose MRSA infection nor to guide or monitor treatment for MRSA infections. Test performance is not FDA approved in patients less than 42 years old. Performed at [facility], [address] [place], [place] [address]     Coagulation Studies: No results for input(s): "LABPROT", "INR" in the last 72 hours.  Urinalysis: No results for input(s): "COLORURINE", "LABSPEC", "PHURINE", "GLUCOSEU", "HGBUR", "BILIRUBINUR", "KETONESUR", "PROTEINUR", "UROBILINOGEN", "NITRITE", "LEUKOCYTESUR" in the last 72 hours.  Invalid input(s): "APPERANCEUR"    Imaging: DG Chest  Port 1 View Result Date: 04/24/2023 CLINICAL DATA:  Follow-up right-sided pneumothorax EXAM: PORTABLE CHEST 1 VIEW COMPARISON:  Film from earlier in the same day. FINDINGS: Pigtail catheter on the right has been removed in the interval. No residual pneumothorax is seen. No focal infiltrate or effusion is noted. Cardiac shadow is stable. No bony abnormality is noted. IMPRESSION: No recurrent pneumothorax following chest tube removal on the right. Electronically Signed   By: Alcide Clever M.D.   On: 04/24/2023 22:42   DG Chest Port 1 View Result Date: 04/24/2023 CLINICAL DATA:  Follow-up pneumothorax EXAM: PORTABLE CHEST 1 VIEW COMPARISON:  04/24/2023 FINDINGS: RIGHT chest tube in place. No appreciable pneumothorax. Chronic bronchitic markings noted. Low lung volumes and mild basilar atelectasis. IMPRESSION: No pneumothorax.   Chest tube in place. Electronically Signed   By: Genevive Bi M.D.   On: 04/24/2023 18:41   DG Chest Port 1 View Result Date: 04/24/2023 CLINICAL DATA:  Follow up pneumothorax. EXAM: PORTABLE CHEST 1 VIEW COMPARISON:  Radiographs 04/23/2023 and 04/22/2023.  CT 04/22/2023. FINDINGS: 0756 hours. Small caliber pigtail right chest tube is unchanged in position. No residual pneumothorax demonstrated. There is mild atelectasis at both lung bases. The heart size and mediastinal contours are stable. No significant chest wall emphysema identified. The bones appear unchanged. IMPRESSION: No residual pneumothorax demonstrated with right chest tube in place. Mild bibasilar atelectasis. Electronically Signed   By: Carey Bullocks M.D.   On: 04/24/2023 08:28   MR LUMBAR SPINE WO CONTRAST Result Date: 04/23/2023 CLINICAL DATA:  Initial evaluation for acute trauma, lower back pain. EXAM: MRI LUMBAR SPINE WITHOUT CONTRAST MRI SACRUM WITHOUT CONTRAST TECHNIQUE: Multiplanar, multisequence MR imaging of the lumbar spine and sacrum was performed. No intravenous contrast was administered. COMPARISON:  Comparison made with prior CT from 04/21/2023 and MRI from 09/16/2021. FINDINGS: MRI LUMBAR SPINE FINDINGS Segmentation: Standard. Lowest well-formed disc space labeled the L5-S1 level. Alignment: 5 mm anterolisthesis of L4 on L5, with 3 mm anterolisthesis of L5 on S1. Findings chronic and facet mediated. Underlying mild levoscoliosis. Vertebrae: Subtle acute compression fracture with marrow edema seen involving the inferior endplate of L4 without significant height loss or retropulsion (series 3, image 7). No other acute or recent fracture within the lumbar spine. Chronic compression deformity of L1 with up to 80% central height loss and 6 mm bony retropulsion. Additional mild chronic compression deformity of L2 with mild 25% height loss and no more than trace bony retropulsion. Vertebral body height otherwise maintained. Bone  marrow signal intensity within normal limits. No worrisome osseous lesions. No other abnormal marrow edema. Conus medullaris and cauda equina: Conus extends to the L1-2 level. Conus and cauda equina appear normal. Paraspinal and other soft tissues: Paraspinous soft tissues within normal limits. Disc levels: T12-L1: Disc desiccation with mild disc bulge. 6 mm bony retropulsion related to the chronic L1 fracture. Mild spinal stenosis. Mild left L1 foraminal narrowing. Right neural foramen remains patent. L1-2: Disc desiccation without disc bulge. No canal or foraminal stenosis. L2-3: Disc desiccation without disc bulge. Trace bony retropulsion related to the L2 fracture. Mild facet hypertrophy. Resultant mild left with borderline right lateral recess stenosis. Central canal remains patent. Mild to moderate bilateral L2 foraminal stenosis. L3-4: Minimal disc bulge. Mild facet and ligament flavum hypertrophy. No significant spinal stenosis. Foramina remain patent. L4-5: Anterolisthesis. Disc desiccation with mild disc bulge. Moderate facet and ligament flavum hypertrophy. No significant spinal stenosis. Mild bilateral L4 foraminal narrowing. L5-S1: Trace anterolisthesis. Disc desiccation with mild  disc bulge. Reactive endplate spurring on the right. Mild facet hypertrophy. No spinal stenosis. Foramina remain patent. MRI SACRUM FINDINGS Sacrum intact without acute or recent fracture. SI joints symmetric and within normal limits. No evidence for acute or chronic sacroiliitis. Visualized pelvis intact. Bone marrow signal intensity within normal limits. No worrisome osseous lesions. Symmetric edema noted within the gluteus minimus musculature bilaterally, nonspecific, but possibly related to overall volume status. Visualized musculature otherwise unremarkable. Visualized visceral structures within normal limits. Normal vascular flow void seen within the visualized pelvis. No visible adenopathy. IMPRESSION: MRI LUMBAR SPINE:  1. Subtle acute compression fracture involving the inferior endplate of L4 without significant height loss or retropulsion. 2. 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 3. Underlying multilevel lumbar spondylosis and facet hypertrophy as above. No high-grade spinal stenosis. Mild to moderate bilateral L2 foraminal narrowing, with mild bilateral L4 foraminal stenosis. MRI SACRUM: Negative MRI of the sacrum.  No acute fracture or other abnormality. Electronically Signed   By: Virgia Griffins M.D.   On: 04/23/2023 20:41   MR SACRUM SI JOINTS WO CONTRAST Result Date: 04/23/2023 CLINICAL DATA:  Initial evaluation for acute trauma, lower back pain. EXAM: MRI LUMBAR SPINE WITHOUT CONTRAST MRI SACRUM WITHOUT CONTRAST TECHNIQUE: Multiplanar, multisequence MR imaging of the lumbar spine and sacrum was performed. No intravenous contrast was administered. COMPARISON:  Comparison made with prior CT from 04/21/2023 and MRI from 09/16/2021. FINDINGS: MRI LUMBAR SPINE FINDINGS Segmentation: Standard. Lowest well-formed disc space labeled the L5-S1 level. Alignment: 5 mm anterolisthesis of L4 on L5, with 3 mm anterolisthesis of L5 on S1. Findings chronic and facet mediated. Underlying mild levoscoliosis. Vertebrae: Subtle acute compression fracture with marrow edema seen involving the inferior endplate of L4 without significant height loss or retropulsion (series 3, image 7). No other acute or recent fracture within the lumbar spine. Chronic compression deformity of L1 with up to 80% central height loss and 6 mm bony retropulsion. Additional mild chronic compression deformity of L2 with mild 25% height loss and no more than trace bony retropulsion. Vertebral body height otherwise maintained. Bone marrow signal intensity within normal limits. No worrisome osseous lesions. No other abnormal marrow edema. Conus medullaris and cauda equina: Conus extends to the L1-2  level. Conus and cauda equina appear normal. Paraspinal and other soft tissues: Paraspinous soft tissues within normal limits. Disc levels: T12-L1: Disc desiccation with mild disc bulge. 6 mm bony retropulsion related to the chronic L1 fracture. Mild spinal stenosis. Mild left L1 foraminal narrowing. Right neural foramen remains patent. L1-2: Disc desiccation without disc bulge. No canal or foraminal stenosis. L2-3: Disc desiccation without disc bulge. Trace bony retropulsion related to the L2 fracture. Mild facet hypertrophy. Resultant mild left with borderline right lateral recess stenosis. Central canal remains patent. Mild to moderate bilateral L2 foraminal stenosis. L3-4: Minimal disc bulge. Mild facet and ligament flavum hypertrophy. No significant spinal stenosis. Foramina remain patent. L4-5: Anterolisthesis. Disc desiccation with mild disc bulge. Moderate facet and ligament flavum hypertrophy. No significant spinal stenosis. Mild bilateral L4 foraminal narrowing. L5-S1: Trace anterolisthesis. Disc desiccation with mild disc bulge. Reactive endplate spurring on the right. Mild facet hypertrophy. No spinal stenosis. Foramina remain patent. MRI SACRUM FINDINGS Sacrum intact without acute or recent fracture. SI joints symmetric and within normal limits. No evidence for acute or chronic sacroiliitis. Visualized pelvis intact. Bone marrow signal intensity within normal limits. No worrisome osseous lesions. Symmetric edema  noted within the gluteus minimus musculature bilaterally, nonspecific, but possibly related to overall volume status. Visualized musculature otherwise unremarkable. Visualized visceral structures within normal limits. Normal vascular flow void seen within the visualized pelvis. No visible adenopathy. IMPRESSION: MRI LUMBAR SPINE: 1. Subtle acute compression fracture involving the inferior endplate of L4 without significant height loss or retropulsion. 2. Chronic compression deformities of L1 and  L2 with up to 80% height loss at L1 and 25% height loss at L2. 3. Underlying multilevel lumbar spondylosis and facet hypertrophy as above. No high-grade spinal stenosis. Mild to moderate bilateral L2 foraminal narrowing, with mild bilateral L4 foraminal stenosis. MRI SACRUM: Negative MRI of the sacrum.  No acute fracture or other abnormality. Electronically Signed   By: Rise Mu M.D.   On: 04/23/2023 20:41   MR THORACIC SPINE WO CONTRAST Result Date: 04/23/2023 CLINICAL DATA:  Initial evaluation for acute mid back pain, fractures. EXAM: MRI THORACIC SPINE WITHOUT CONTRAST TECHNIQUE: Multiplanar, multisequence MR imaging of the thoracic spine was performed. No intravenous contrast was administered. COMPARISON:  Comparison made with prior CT from 04/21/2023 FINDINGS: Alignment: Exaggeration of the normal thoracic kyphosis. Trace degenerative anterolisthesis of T2 on T3. Vertebrae: Compression fracture involving the T5 vertebral body with up to 25% height loss without bony retropulsion demonstrates persistent marrow edema, consistent with a probable evolving subacute fracture. Otherwise, vertebral body height maintained within the thoracic spine. No other acute or recent fracture. Bone marrow signal intensity within normal limits. Few small benign hemangiomata noted. No worrisome osseous lesions. No abnormal marrow edema. Note is made of a chronic L1 compression fracture as well, described on corresponding MRI of the lumbar spine. Cord:  Normal signal and morphology. Paraspinal and other soft tissues: Paraspinous soft tissues demonstrate no acute finding. Small layering bilateral pleural effusions, right larger than left. Disc levels: Ordinary for age multilevel disc desiccation with mild noncompressive disc bulging noted within the thoracic spine. No significant spinal stenosis. Foramina remain patent. IMPRESSION: 1. Subacute compression fracture involving the T5 vertebral body with up to 25% height  loss without bony retropulsion. 2. No other acute osseous abnormality within the thoracic spine. 3. Small layering bilateral pleural effusions, right larger than left. Electronically Signed   By: Rise Mu M.D.   On: 04/23/2023 20:14     Medications:    chlorproMAZINE (THORAZINE) 12.5 mg in sodium chloride 0.9 % 25 mL IVPB 12.5 mg (04/23/23 2045)    buPROPion  150 mg Oral BID   calcium carbonate  1 tablet Oral BID WC   Chlorhexidine Gluconate Cloth  6 each Topical Daily   enoxaparin (LOVENOX) injection  40 mg Subcutaneous Q24H   famotidine  20 mg Oral BID   feeding supplement  237 mL Oral BID BM   gabapentin  100 mg Oral BID   melatonin  2.5 mg Oral QHS   polyethylene glycol  17 g Oral Daily   senna-docusate  2 tablet Oral QHS   traZODone  100 mg Oral QHS   acetaminophen, alum & mag hydroxide-simeth, chlorproMAZINE (THORAZINE) 12.5 mg in sodium chloride 0.9 % 25 mL IVPB, docusate sodium, naLOXone (NARCAN)  injection, ondansetron (ZOFRAN) IV, mouth rinse, oxyCODONE, polyethylene glycol, tiZANidine  Assessment/ Plan:  78 y.o. 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  Patient was first noted to have mild hyponatremia in September 2024 in January 2025.  Sodium level of 133-134 was noted.  Prior to admission on 04/18/2023, her sodium was 132. Sodium level progressively got worse during hospitalization. No diuretic.  Thyroid  function last year was normal. Chest CT is Abnormal and does demonstrate consolidation as noted above. Patient reports her appetite is poor and she has been drinking good amount of water.   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   Plan: Recommended to patient to try to drink high-protein drinks such as Ensure twice a day. Follow-up fluid and fluid restriction to about 32 ounces per day Pain control as per primary team. Sodium has improved to 127 today.  Continue supportive care.    LOS: 4 Kathryn Hart 4/15/20254:17 PM  [facility] [address] [place], [place] [phone]

## 2023-04-26 DIAGNOSIS — J939 Pneumothorax, unspecified: Secondary | ICD-10-CM | POA: Diagnosis not present

## 2023-04-26 LAB — BASIC METABOLIC PANEL WITH GFR
Anion gap: 6 (ref 5–15)
BUN: 9 mg/dL (ref 8–23)
CO2: 29 mmol/L (ref 22–32)
Calcium: 9 mg/dL (ref 8.9–10.3)
Chloride: 95 mmol/L — ABNORMAL LOW (ref 98–111)
Creatinine, Ser: 0.58 mg/dL (ref 0.44–1.00)
GFR, Estimated: >60 mL/min (ref >60)
Glucose, Bld: 115 mg/dL — ABNORMAL HIGH (ref 70–99)
Potassium: 4.5 mmol/L (ref 3.5–5.1)
Sodium: 130 mmol/L — ABNORMAL LOW (ref 135–145)

## 2023-04-26 LAB — PROTEIN ELECTROPHORESIS, SERUM
A/G Ratio: 1.2 (ref 0.7–1.7)
Albumin ELP: 3 g/dL (ref 2.9–4.4)
Alpha-1-Globulin: 0.3 g/dL (ref 0.0–0.4)
Alpha-2-Globulin: 0.7 g/dL (ref 0.4–1.0)
Beta Globulin: 0.7 g/dL (ref 0.7–1.3)
Gamma Globulin: 0.8 g/dL (ref 0.4–1.8)
Globulin, Total: 2.5 g/dL (ref 2.2–3.9)
Total Protein ELP: 5.5 g/dL — ABNORMAL LOW (ref 6.0–8.5)

## 2023-04-26 LAB — KAPPA/LAMBDA LIGHT CHAINS
Kappa free light chain: 27.2 mg/L — ABNORMAL HIGH (ref 3.3–19.4)
Kappa, lambda light chain ratio: 1.69 — ABNORMAL HIGH (ref 0.26–1.65)
Lambda free light chains: 16.1 mg/L (ref 5.7–26.3)

## 2023-04-26 MED ORDER — OXYCODONE HCL 5 MG PO TABS: 5.0000 mg | ORAL_TABLET | Freq: Four times a day (QID) | ORAL | 0 refills | Status: AC | PRN

## 2023-04-26 MED ORDER — GABAPENTIN 100 MG PO CAPS: 100.0000 mg | ORAL_CAPSULE | Freq: Two times a day (BID) | ORAL | 0 refills | Status: DC

## 2023-04-26 NOTE — Discharge Summary (Addendum)
 Physician Discharge Summary  Kathryn Hart ZOX:[id number] DOB: [birth year] DOA: 04/21/2023  PCP: Rayetta Humphrey, MD  Admit date: 04/21/2023 Discharge date: 04/26/2023  Admitted From: home Disposition:  home w/ home health   Recommendations for Outpatient Follow-up:  Follow up with PCP in 1-2 weeks F/u neuro surg, Dr. Katrinka Blazing, in 1-2 weeks Get BMP within 1-2 weeks to check sodium level   Home Health: yes Equipment/Devices:  Discharge Condition: stable  CODE STATUS: full  Diet recommendation: regular w/ fluid restriction of 1200 cc per day as per nephro  Brief/Interim Summary: HPI was taken from Dr. Belia Heman: 78 y.o. female with past medical history of left-sided breast cancer status post left lumpectomy, hysterectomy, radiation therapy, usually in good state of health, being seen in consultation after presenting to the emergency room with neck pain following a fall down a flight of 12 steps.     States her toddler grandchild was climbing up the stairs and she was going up behind her, playing with her along the way and then lost her footing falling down the stairs.  She had immediate onset of neck pain and upper back pain.   Workup in the ED showed normal vitals, mild leukocytosis of 16,000, mild hyponatremia of 131 Pan trauma scan most significant for C1 fracture without acute vascular injury and rib fractures with moderate right hemothorax with partial lung collapse as further detailed below:   IMPRESSION: 1. Moderate right pneumothorax with partial collapse of the right lung and mild relative hyperinflation of the right hemithorax which may reflect subtle changes of tension physiology. 2. Acute fractures of the right 5-9 ribs posteriorly with displacement of the right sixth rib fracture. 3. Superimposed airway impaction and tree-in-bud nodularity within the right upper and right lower lobes which may relate to acute or chronic infection. 4. 2.7 cm left thyroid nodule. This  was previously described on thyroid sonogram 03/14/2017 and was better assessed on that examination. 5. Diffuse osteopenia. 6. Remote compression deformities of T5, L1, and L2. 7. Remote, nondisplaced insufficiency fracture of the right sacral   Neuro surgery was consulted from the ED and recommended a [place] J cervical collar MULTIPLE ATTEMPTS BY ER  placement of chest tube/ pigtail catheter was unsuccessful. PCCM asked to admit since [facility] states not in their scope of practice to admit PTX PCCM admitted patient under SD status, no ICU meeds     At this time She is denying shortness of breath. 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 Chest tube removed on 04/24/23 as per pulmon. No residual or recurrent pneumothorax after chest tube removed. Resolved   Multiple rib fractures: right 5-9 with displaced 6th. Encourage incentive spirometry. Oxycodone prn    C1 cervical fracture: continue w/ C collar for 6-8 weeks as per neuro surg. F/u outpatient w/ neuro surg, Dr. Katrinka Blazing, in 1-2 weeks    Fall: down 12 stairs. Accidental fall &  denies LOC. MRI of the lumbar spine shows subtle acute compression fracture involving the inferior endplate of L4 without significant height loss or retropulsion. 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 Will f/u outpatient w/  neuro surg, Dr. Katrinka Blazing    Hx of breast cancer: s/p left lumpectomy hysterectomy and radiation therapy. Management per onco outpatient    Osteopenia: continue on calcium supplements. Remote compression deformities T5, L1 and L2. Remote nondisplaced insufficiency fracture right sacrum on CT. 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 Continue on 1200 cc per day fluid restriction as per nephro. Sodium is trending up   Discharge Instructions  Discharge Instructions     Diet general   Complete by: As directed    Fluid restriction 1200 cc per day   Discharge instructions   Complete by: As directed    F/u w/ PCP in 1-2 weeks. Need to BMP to check sodium level within 1-2 weeks. F/u w/ neuro surg, Dr. Apolinar Junes, in 1-2 weeks. Fluid restriction 1200 cc per day   Increase activity slowly   Complete by: As directed       Allergies as of 04/26/2023   No Known Allergies      Medication List     STOP taking these medications    buPROPion 150 MG 12 hr tablet Commonly known as: WELLBUTRIN SR       TAKE these medications    buPROPion 150 MG 12 hr tablet Commonly known as: ZYBAN Take 150 mg by mouth 2 (two) times daily.   gabapentin 100 MG capsule Commonly known as: NEURONTIN Take 1 capsule (100 mg total) by mouth 2 (two) times daily.   oxyCODONE 5 MG immediate release tablet Commonly known as: Oxy IR/ROXICODONE Take 1 tablet (5 mg total) by mouth every 6 (six) hours as needed for up to 5 days for moderate pain (pain score 4-6), breakthrough pain or severe pain (pain score 7-10).   tiZANidine 4 MG tablet Commonly known as: ZANAFLEX Take 4 mg by mouth 3 (three) times daily as needed.   traZODone 100 MG tablet Commonly known as: DESYREL Take 100 mg by mouth at bedtime.               Durable Medical Equipment  (From admission, onward)           Start     Ordered   04/26/23 1338  For home use only DME 3 n 1  Once         04/26/23 1338            Follow-up Information     Rayetta Humphrey, MD Follow up.   Specialty: Family Medicine Why: Hospital follow up Contact information: [address] [place] [address] [phone]         Joan Flores, PA-C Follow up on 05/08/2023.   Specialty: Physician Assistant Why: fracture follow up Contact information: [address] [place] [place] [address] [phone]         Care, [facility] Follow up.   Why: 04/16---Cheryl--soc is 94/17/2025 Contact information: [address] [place] [address] [phone]         [facility], [facility] Follow up.   Why: 04/16--coordination of care for delivery to home address on file, patient aware. Contact information: [address] [place] [address] [phone]  No Known Allergies  Consultations: Neuro surg Nephro    Procedures/Studies: DG Chest Port 1 View Result Date: 04/25/2023 CLINICAL DATA:  Shortness of breath.  Follow-up pneumothorax EXAM: PORTABLE CHEST 1 VIEW COMPARISON:  04/24/2023 FINDINGS: No residual or recurrent pneumothorax seen. Patchy opacity in the right mid lung again noted, unchanged. No confluent opacity on the left. Heart mediastinal contours are within normal limits. No visible effusion. No acute bony abnormality. IMPRESSION: No residual or recurrent pneumothorax. Electronically Signed   By: Janeece Mechanic M.D.   On: 04/25/2023 17:21   DG Chest Port 1 View Result Date: 04/24/2023 CLINICAL DATA:  Follow-up right-sided pneumothorax EXAM: PORTABLE CHEST 1 VIEW COMPARISON:  Film from earlier in the same day. FINDINGS: Pigtail catheter on the right has been removed in the interval. No residual pneumothorax is seen. No focal infiltrate or effusion is noted. Cardiac shadow is stable. No bony abnormality is noted. IMPRESSION: No recurrent pneumothorax following chest tube removal on the right. Electronically  Signed   By: Violeta Grey M.D.   On: 04/24/2023 22:42   DG Chest Port 1 View Result Date: 04/24/2023 CLINICAL DATA:  Follow-up pneumothorax EXAM: PORTABLE CHEST 1 VIEW COMPARISON:  04/24/2023 FINDINGS: RIGHT chest tube in place. No appreciable pneumothorax. Chronic bronchitic markings noted. Low lung volumes and mild basilar atelectasis. IMPRESSION: No pneumothorax.  Chest tube in place. Electronically Signed   By: Deboraha Fallow M.D.   On: 04/24/2023 18:41   DG Chest Port 1 View Result Date: 04/24/2023 CLINICAL DATA:  Follow up pneumothorax. EXAM: PORTABLE CHEST 1 VIEW COMPARISON:  Radiographs 04/23/2023 and 04/22/2023.  CT 04/22/2023. FINDINGS: 0756 hours. Small caliber pigtail right chest tube is unchanged in position. No residual pneumothorax demonstrated. There is mild atelectasis at both lung bases. The heart size and mediastinal contours are stable. No significant chest wall emphysema identified. The bones appear unchanged. IMPRESSION: No residual pneumothorax demonstrated with right chest tube in place. Mild bibasilar atelectasis. Electronically Signed   By: Elmon Hagedorn M.D.   On: 04/24/2023 08:28   MR LUMBAR SPINE WO CONTRAST Result Date: 04/23/2023 CLINICAL DATA:  Initial evaluation for acute trauma, lower back pain. EXAM: MRI LUMBAR SPINE WITHOUT CONTRAST MRI SACRUM WITHOUT CONTRAST TECHNIQUE: Multiplanar, multisequence MR imaging of the lumbar spine and sacrum was performed. No intravenous contrast was administered. COMPARISON:  Comparison made with prior CT from 04/21/2023 and MRI from 09/16/2021. FINDINGS: MRI LUMBAR SPINE FINDINGS Segmentation: Standard. Lowest well-formed disc space labeled the L5-S1 level. Alignment: 5 mm anterolisthesis of L4 on L5, with 3 mm anterolisthesis of L5 on S1. Findings chronic and facet mediated. Underlying mild levoscoliosis. Vertebrae: Subtle acute compression fracture with marrow edema seen involving the inferior endplate of L4 without significant  height loss or retropulsion (series 3, image 7). No other acute or recent fracture within the lumbar spine. Chronic compression deformity of L1 with up to 80% central height loss and 6 mm bony retropulsion. Additional mild chronic compression deformity of L2 with mild 25% height loss and no more than trace bony retropulsion. Vertebral body height otherwise maintained. Bone marrow signal intensity within normal limits. No worrisome osseous lesions. No other abnormal marrow edema. Conus medullaris and cauda equina: Conus extends to the L1-2 level. Conus and cauda equina appear normal. Paraspinal and other soft tissues: Paraspinous soft tissues within normal limits. Disc levels: T12-L1: Disc desiccation with mild disc bulge. 6 mm bony retropulsion related to the chronic L1 fracture. Mild spinal stenosis. Mild left L1 foraminal narrowing. Right neural  foramen remains patent. L1-2: Disc desiccation without disc bulge. No canal or foraminal stenosis. L2-3: Disc desiccation without disc bulge. Trace bony retropulsion related to the L2 fracture. Mild facet hypertrophy. Resultant mild left with borderline right lateral recess stenosis. Central canal remains patent. Mild to moderate bilateral L2 foraminal stenosis. L3-4: Minimal disc bulge. Mild facet and ligament flavum hypertrophy. No significant spinal stenosis. Foramina remain patent. L4-5: Anterolisthesis. Disc desiccation with mild disc bulge. Moderate facet and ligament flavum hypertrophy. No significant spinal stenosis. Mild bilateral L4 foraminal narrowing. L5-S1: Trace anterolisthesis. Disc desiccation with mild disc bulge. Reactive endplate spurring on the right. Mild facet hypertrophy. No spinal stenosis. Foramina remain patent. MRI SACRUM FINDINGS Sacrum intact without acute or recent fracture. SI joints symmetric and within normal limits. No evidence for acute or chronic sacroiliitis. Visualized pelvis intact. Bone marrow signal intensity within normal limits.  No worrisome osseous lesions. Symmetric edema noted within the gluteus minimus musculature bilaterally, nonspecific, but possibly related to overall volume status. Visualized musculature otherwise unremarkable. Visualized visceral structures within normal limits. Normal vascular flow void seen within the visualized pelvis. No visible adenopathy. IMPRESSION: MRI LUMBAR SPINE: 1. Subtle acute compression fracture involving the inferior endplate of L4 without significant height loss or retropulsion. 2. 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 3. Underlying multilevel lumbar spondylosis and facet hypertrophy as above. No high-grade spinal stenosis. Mild to moderate bilateral L2 foraminal narrowing, with mild bilateral L4 foraminal stenosis. MRI SACRUM: Negative MRI of the sacrum.  No acute fracture or other abnormality. Electronically Signed   By: Virgia Griffins M.D.   On: 04/23/2023 20:41   MR SACRUM SI JOINTS WO CONTRAST Result Date: 04/23/2023 CLINICAL DATA:  Initial evaluation for acute trauma, lower back pain. EXAM: MRI LUMBAR SPINE WITHOUT CONTRAST MRI SACRUM WITHOUT CONTRAST TECHNIQUE: Multiplanar, multisequence MR imaging of the lumbar spine and sacrum was performed. No intravenous contrast was administered. COMPARISON:  Comparison made with prior CT from 04/21/2023 and MRI from 09/16/2021. FINDINGS: MRI LUMBAR SPINE FINDINGS Segmentation: Standard. Lowest well-formed disc space labeled the L5-S1 level. Alignment: 5 mm anterolisthesis of L4 on L5, with 3 mm anterolisthesis of L5 on S1. Findings chronic and facet mediated. Underlying mild levoscoliosis. Vertebrae: Subtle acute compression fracture with marrow edema seen involving the inferior endplate of L4 without significant height loss or retropulsion (series 3, image 7). No other acute or recent fracture within the lumbar spine. Chronic compression deformity of L1 with up to 80% central  height loss and 6 mm bony retropulsion. Additional mild chronic compression deformity of L2 with mild 25% height loss and no more than trace bony retropulsion. Vertebral body height otherwise maintained. Bone marrow signal intensity within normal limits. No worrisome osseous lesions. No other abnormal marrow edema. Conus medullaris and cauda equina: Conus extends to the L1-2 level. Conus and cauda equina appear normal. Paraspinal and other soft tissues: Paraspinous soft tissues within normal limits. Disc levels: T12-L1: Disc desiccation with mild disc bulge. 6 mm bony retropulsion related to the chronic L1 fracture. Mild spinal stenosis. Mild left L1 foraminal narrowing. Right neural foramen remains patent. L1-2: Disc desiccation without disc bulge. No canal or foraminal stenosis. L2-3: Disc desiccation without disc bulge. Trace bony retropulsion related to the L2 fracture. Mild facet hypertrophy. Resultant mild left with borderline right lateral recess stenosis. Central canal remains patent. Mild to moderate bilateral L2 foraminal stenosis. L3-4: Minimal disc bulge. Mild facet  and ligament flavum hypertrophy. No significant spinal stenosis. Foramina remain patent. L4-5: Anterolisthesis. Disc desiccation with mild disc bulge. Moderate facet and ligament flavum hypertrophy. No significant spinal stenosis. Mild bilateral L4 foraminal narrowing. L5-S1: Trace anterolisthesis. Disc desiccation with mild disc bulge. Reactive endplate spurring on the right. Mild facet hypertrophy. No spinal stenosis. Foramina remain patent. MRI SACRUM FINDINGS Sacrum intact without acute or recent fracture. SI joints symmetric and within normal limits. No evidence for acute or chronic sacroiliitis. Visualized pelvis intact. Bone marrow signal intensity within normal limits. No worrisome osseous lesions. Symmetric edema noted within the gluteus minimus musculature bilaterally, nonspecific, but possibly related to overall volume status.  Visualized musculature otherwise unremarkable. Visualized visceral structures within normal limits. Normal vascular flow void seen within the visualized pelvis. No visible adenopathy. IMPRESSION: MRI LUMBAR SPINE: 1. Subtle acute compression fracture involving the inferior endplate of L4 without significant height loss or retropulsion. 2. 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 3. Underlying multilevel lumbar spondylosis and facet hypertrophy as above. No high-grade spinal stenosis. Mild to moderate bilateral L2 foraminal narrowing, with mild bilateral L4 foraminal stenosis. MRI SACRUM: Negative MRI of the sacrum.  No acute fracture or other abnormality. Electronically Signed   By: Virgia Griffins M.D.   On: 04/23/2023 20:41   MR THORACIC SPINE WO CONTRAST Result Date: 04/23/2023 CLINICAL DATA:  Initial evaluation for acute mid back pain, fractures. EXAM: MRI THORACIC SPINE WITHOUT CONTRAST TECHNIQUE: Multiplanar, multisequence MR imaging of the thoracic spine was performed. No intravenous contrast was administered. COMPARISON:  Comparison made with prior CT from 04/21/2023 FINDINGS: Alignment: Exaggeration of the normal thoracic kyphosis. Trace degenerative anterolisthesis of T2 on T3. Vertebrae: Compression fracture involving the T5 vertebral body with up to 25% height loss without bony retropulsion demonstrates persistent marrow edema, consistent with a probable evolving subacute fracture. Otherwise, vertebral body height maintained within the thoracic spine. No other acute or recent fracture. Bone marrow signal intensity within normal limits. Few small benign hemangiomata noted. No worrisome osseous lesions. No abnormal marrow edema. Note is made of a chronic L1 compression fracture as well, described on corresponding MRI of the lumbar spine. Cord:  Normal signal and morphology. Paraspinal and other soft tissues: Paraspinous soft tissues  demonstrate no acute finding. Small layering bilateral pleural effusions, right larger than left. Disc levels: Ordinary for age multilevel disc desiccation with mild noncompressive disc bulging noted within the thoracic spine. No significant spinal stenosis. Foramina remain patent. IMPRESSION: 1. Subacute compression fracture involving the T5 vertebral body with up to 25% height loss without bony retropulsion. 2. No other acute osseous abnormality within the thoracic spine. 3. Small layering bilateral pleural effusions, right larger than left. Electronically Signed   By: Virgia Griffins M.D.   On: 04/23/2023 20:14   DG Chest Port 1 View Result Date: 04/23/2023 CLINICAL DATA:  Right-sided pneumothorax. EXAM: PORTABLE CHEST 1 VIEW COMPARISON:  04/22/2023 FINDINGS: Tiny right apical pneumothorax seen previously has decreased in the interval. Right pleural drain remains in place. Streaky density in the right mid lung is similar to prior. Left lung clear. Cardiopericardial silhouette is at upper limits of normal for size. No acute bony abnormality. Telemetry leads overlie the chest. IMPRESSION: Interval decrease in tiny right apical pneumothorax. Right pleural drain remains in place. Electronically Signed   By: Donnal Fusi M.D.   On: 04/23/2023 07:41   CT CHEST WO CONTRAST Result Date: 04/22/2023  CLINICAL DATA:  Suspected pneumothorax.  History of blunt trauma EXAM: CT CHEST WITHOUT CONTRAST TECHNIQUE: Multidetector CT imaging of the chest was performed following the standard protocol without IV contrast. RADIATION DOSE REDUCTION: This exam was performed according to the departmental dose-optimization program which includes automated exposure control, adjustment of the mA and/or kV according to patient size and/or use of iterative reconstruction technique. COMPARISON:  the previous day's study FINDINGS: Cardiovascular: Heart size normal. Trace pericardial fluid. Scattered coronary and aortic calcifications.  Mitral annulus coarse calcifications. Mediastinum/Nodes: No mediastinal mass or adenopathy. Left thyroid nodule as characterized on previous ultrasound 03/14/2017. Lungs/Pleura: Small right pleural effusion. Small-moderate anterior right pneumothorax, perhaps slightly decreased from previous. Consolidation/atelectasis of the right middle lobe, and posteriorly in the right upper and lower lobes. Stable patchy consolidations/atelectasis in the inferior lingula. Upper Abdomen: No acute findings. Musculoskeletal: Minimally displaced fractures of the posterior aspect right ribs 5-9 as before. Stable mild vertebral compression deformity of T5, moderate deformity of L1, mild deformity of L2. IMPRESSION: 1. Small-moderate anterior right pneumothorax, perhaps slightly decreased from previous. 2. Small right pleural effusion. 3. Consolidation/atelectasis of the right middle lobe, and posteriorly in the right upper and lower lobes. 4. Stable patchy consolidations/atelectasis in the inferior lingula. 5. Minimally displaced fractures of the posterior aspect right ribs 5-9. 6. Stable vertebral compression deformities of T5, L1, and L2. Electronically Signed   By: Nicoletta Barrier M.D.   On: 04/22/2023 10:44   DG Chest Port 1 View Result Date: 04/22/2023 CLINICAL DATA:  Pneumothorax, post chest tube placement EXAM: PORTABLE CHEST - 1 VIEW COMPARISON:  Earlier film of the same day FINDINGS: A pigtail chest tube has been placed laterally in the right hemithorax, with near complete evacuation of the previously demonstrated pneumothorax. Trace residual is noted medially at the apex. Ill-defined airspace opacity in the right mid lung, less consolidated than on prior study. Left lung clear. Heart size and mediastinal contours are within normal limits. Aortic Atherosclerosis (ICD10-170.0). Stable blunting right lateral costophrenic angle. L1 compression deformity as before. IMPRESSION: Near complete evacuation of right pneumothorax  following chest tube placement. Electronically Signed   By: Nicoletta Barrier M.D.   On: 04/22/2023 10:37   DG Chest Port 1 View Result Date: 04/22/2023 CLINICAL DATA:  Pneumothorax on the right EXAM: PORTABLE CHEST 1 VIEW COMPARISON:  Yesterday FINDINGS: Right apical pneumothorax remains small, 17 mm in thickness as measured similar to prior. Unchanged bandlike opacity in the right mid chest from atelectasis. No edema, effusion, or pneumothorax. Stable heart size and mediastinal contours. IMPRESSION: Unchanged small right apical pneumothorax. Electronically Signed   By: Ronnette Coke M.D.   On: 04/22/2023 05:14   CT CHEST ABDOMEN PELVIS W CONTRAST Result Date: 04/21/2023 CLINICAL DATA:  Fall, cervical spine fracture, blunt chest and abdominal trauma EXAM: CT CHEST, ABDOMEN, AND PELVIS WITH CONTRAST TECHNIQUE: Multidetector CT imaging of the chest, abdomen and pelvis was performed following the standard protocol during bolus administration of intravenous contrast. RADIATION DOSE REDUCTION: This exam was performed according to the departmental dose-optimization program which includes automated exposure control, adjustment of the mA and/or kV according to patient size and/or use of iterative reconstruction technique. CONTRAST:  100mL OMNIPAQUE IOHEXOL 350 MG/ML SOLN COMPARISON:  None Available. FINDINGS: CT CHEST FINDINGS Cardiovascular: Mild coronary artery calcification. Global cardiac size within normal limits. No pericardial effusion. Central pulmonary arteries are of normal caliber. Mild atherosclerotic calcification within the thoracic aorta. No aortic aneurysm. Mediastinum/Nodes: 2.7 cm left thyroid nodule was previously described  on thyroid sonogram 03/14/2017 and was better assessed on that examination. No pathologic thoracic adenopathy. The esophagus is unremarkable. Lungs/Pleura: Moderate right pneumothorax is present with partial collapse of the right lung and mild relative hyperinflation of the right  hemithorax which may reflect subtle changes of tension physiology. There is superimposed airway impaction and tree-in-bud nodularity within the right upper and right lower lobes which may may relate to acute or chronic infection. No pneumothorax on the left. No pleural effusion. No central obstructing lesion. Musculoskeletal: There are acute fractures of the right 5-9 ribs posteriorly the region of the right costotransverse junction with displacement of the right sixth rib fracture. There is a remote appearing compression deformity of T5 with mild loss of height. Osseous structures are diffusely osteopenic. CT ABDOMEN PELVIS FINDINGS Hepatobiliary: No focal liver abnormality is seen. No gallstones, gallbladder wall thickening, or biliary dilatation. Pancreas: Unremarkable Spleen: No splenic injury or perisplenic hematoma. Adrenals/Urinary Tract: No adrenal hemorrhage or renal injury identified. Bladder is unremarkable. Stomach/Bowel: Stomach is within normal limits. Appendix appears normal. No evidence of bowel wall thickening, distention, or inflammatory changes. Vascular/Lymphatic: Aortic atherosclerosis. No enlarged abdominal or pelvic lymph nodes. Reproductive: Status post hysterectomy. No adnexal masses. Other: Mild diffuse subcutaneous body wall edema. No abdominal wall hematoma. Musculoskeletal: Sclerosis of the right sacral ala in keeping with a remote, nondisplaced insufficiency fracture. Remote compression deformities L1 and L2 are noted with mild retropulsion of the posterosuperior aspect of the L1 vertebral body. Osseous structures are diffusely osteopenic. No acute bone abnormality. IMPRESSION: 1. Moderate right pneumothorax with partial collapse of the right lung and mild relative hyperinflation of the right hemithorax which may reflect subtle changes of tension physiology. 2. Acute fractures of the right 5-9 ribs posteriorly with displacement of the right sixth rib fracture. 3. Superimposed airway  impaction and tree-in-bud nodularity within the right upper and right lower lobes which may relate to acute or chronic infection. 4. 2.7 cm left thyroid nodule. This was previously described on thyroid sonogram 03/14/2017 and was better assessed on that examination. 5. Diffuse osteopenia. 6. Remote compression deformities of T5, L1, and L2. 7. Remote, nondisplaced insufficiency fracture of the right sacral ala. These results were called by telephone at the time of interpretation on 04/21/2023 at 11:11 pm to provider GRAYDON GOODMAN , who verbally acknowledged these results. Electronically Signed   By: Worthy Heads M.D.   On: 04/21/2023 23:11   DG Chest Portable 1 View Result Date: 04/21/2023 CLINICAL DATA:  Evaluate for pneumothorax. EXAM: PORTABLE CHEST 1 VIEW COMPARISON:  CT chest 04/21/2023. FINDINGS: Small right pneumothorax present measuring 17 mm in lung apex. There is a band of atelectasis or consolidation in the right mid lung. There is no mediastinal shift. The left lung is clear. There is no pleural effusion cardiomediastinal silhouette is within normal limits. No acute fractures are seen. IMPRESSION: 1. Small right pneumothorax. 2. Band of atelectasis or consolidation in the right mid lung. Electronically Signed   By: Tyron Gallon M.D.   On: 04/21/2023 21:54   CT Angio Neck W and/or Wo Contrast Result Date: 04/21/2023 CLINICAL DATA:  C1 lateral mass fracture. EXAM: CT ANGIOGRAPHY NECK TECHNIQUE: Multidetector CT imaging of the neck was performed using the standard protocol during bolus administration of intravenous contrast. Multiplanar CT image reconstructions and MIPs were obtained to evaluate the vascular anatomy. Carotid stenosis measurements (when applicable) are obtained utilizing NASCET criteria, using the distal internal carotid diameter as the denominator. RADIATION DOSE REDUCTION: This exam was  performed according to the departmental dose-optimization program which includes automated  exposure control, adjustment of the mA and/or kV according to patient size and/or use of iterative reconstruction technique. CONTRAST:  100mL OMNIPAQUE IOHEXOL 350 MG/ML SOLN COMPARISON:  None Available. FINDINGS: Aortic arch: No aneurysm or dissection. Mild calcific atherosclerosis. Right carotid system: There is bulky atherosclerotic calcification at the carotid bifurcation extending into the ICA with less than 50% stenosis. Left carotid system: Mild atherosclerotic calcification of the carotid bifurcation. No hemodynamically significant stenosis. Vertebral arteries: Left dominant system. Both vertebral arteries are patent to the vertebrobasilar confluence. There is no vertebral artery dissection. Specifically, the left vertebral artery is normal at the level of the left C1 lateral mass fracture. Skeleton: Redemonstration of nondisplaced fracture through the left C1 lateral mass, traversing the transverse foramen. Other neck: 1.5 cm left thyroid nodule has previously been evaluated with ultrasound. Upper chest: Right apical pneumothorax. More completely characterized on CT chest abdomen pelvis. IMPRESSION: 1. No acute vascular injury of the neck. 2. Redemonstration of nondisplaced fracture through the left C1 lateral mass, traversing the transverse foramen. 3. Right apical pneumothorax. More completely characterized on CT chest abdomen pelvis. Electronically Signed   By: Juanetta Nordmann M.D.   On: 04/21/2023 17:44   DG Lumbar Spine Complete Result Date: 04/21/2023 CLINICAL DATA:  back injury EXAM: LUMBAR SPINE - COMPLETE 4+ VIEW COMPARISON:  MRI lumbar spine from 09/16/2021. FINDINGS: There are 5 nonrib-bearing lumbar vertebrae. Lumbar lordosis is maintained. There is grade 1 anterolisthesis of L4 over L5, grossly similar to the prior study. No spondylolysis. There is moderate loss of height of L1 vertebral body and mild loss of height of L2 vertebral body, similar to the prior MRI exam. The remaining vertebral  body heights are maintained. No aggressive osseous lesion. Mild-to-moderate multilevel degenerative changes in the form of reduced intervertebral disc height, facet arthropathy and marginal osteophyte formation. Sacroiliac joints are symmetric. Visualized soft tissues are within normal limits. IMPRESSION: 1. No acute osseous abnormality of the lumbar spine. 2. Mild-to-moderate multilevel degenerative changes. Electronically Signed   By: Beula Brunswick M.D.   On: 04/21/2023 15:08   CT Head Wo Contrast Result Date: 04/21/2023 CLINICAL DATA:  Head trauma, moderate-severe.  Fall down the stairs. EXAM: CT HEAD WITHOUT CONTRAST CT CERVICAL SPINE WITHOUT CONTRAST TECHNIQUE: Multidetector CT imaging of the head and cervical spine was performed following the standard protocol without intravenous contrast. Multiplanar CT image reconstructions of the cervical spine were also generated. RADIATION DOSE REDUCTION: This exam was performed according to the departmental dose-optimization program which includes automated exposure control, adjustment of the mA and/or kV according to patient size and/or use of iterative reconstruction technique. COMPARISON:  CT scan head from 09/22/2020. FINDINGS: CT HEAD FINDINGS Brain: No evidence of acute infarction, hemorrhage, hydrocephalus, extra-axial collection or mass lesion/mass effect. There is bilateral periventricular hypodensity, which is non-specific but most likely seen in the settings of microvascular ischemic changes. Mild in extent. Otherwise normal appearance of brain parenchyma. Ventricles are normal. Cerebral volume is age appropriate. Vascular: No hyperdense vessel or unexpected calcification. Intracranial arteriosclerosis. Skull: Normal. Negative for fracture or focal lesion. Sinuses/Orbits: No acute finding. Other: Visualized mastoid air cells are unremarkable. No mastoid effusion. CT CERVICAL SPINE FINDINGS Alignment: Normal. This examination does not assess for ligamentous  injury or stability. Skull base and vertebrae: There is comminuted and minimally displaced fracture of the left lateral mass with extension up to the superior and articular facets. The fracture also traverses across the left  transverse foramina. No other cervical bone fracture noted. There is fracture of the posteromedial aspect of the right first rib. There is associated at least small right pneumothorax partially seen. No primary bone lesion or focal pathologic process. Soft tissues and spinal canal: No prevertebral fluid or swelling. No visible canal hematoma. Disc levels: Mild-to-moderate multilevel degenerative changes characterized by reduced intervertebral disc height, facet arthropathy and marginal osteophyte formation. Upper chest: There is small right-sided pneumothorax. There is mildly displaced fracture of right posterior first rib and left posterior second rib. Other: There is a partially calcified hypoattenuating approximately 1.8 x 2.4 cm nodule in the inferior left thyroid lobe, incompletely characterized on the current exam but better characterized with ultrasound from 03/14/2017. IMPRESSION: 1. No acute intracranial abnormality. 2. There is comminuted and minimally displaced fracture of the left lateral mass with extension up to the superior and inferior articular facets. The fracture line also traverses across the left transverse foramina. Further evaluation with neck CTA is recommended to evaluate for vascular injury. 3. There is fracture of the posteromedial aspect of the right first rib. There is associated at least small right pneumothorax, partially seen. There is also mildly displaced fracture of left posterior second rib. Critical Value/emergent results were called by telephone at the time of interpretation on 04/21/2023 at 3:01 pm to provider [facility] , who verbally acknowledged these results. Electronically Signed   By: Beula Brunswick M.D.   On: 04/21/2023 15:05   CT CERVICAL SPINE  WO CONTRAST Result Date: 04/21/2023 CLINICAL DATA:  Head trauma, moderate-severe.  Fall down the stairs. EXAM: CT HEAD WITHOUT CONTRAST CT CERVICAL SPINE WITHOUT CONTRAST TECHNIQUE: Multidetector CT imaging of the head and cervical spine was performed following the standard protocol without intravenous contrast. Multiplanar CT image reconstructions of the cervical spine were also generated. RADIATION DOSE REDUCTION: This exam was performed according to the departmental dose-optimization program which includes automated exposure control, adjustment of the mA and/or kV according to patient size and/or use of iterative reconstruction technique. COMPARISON:  CT scan head from 09/22/2020. FINDINGS: CT HEAD FINDINGS Brain: No evidence of acute infarction, hemorrhage, hydrocephalus, extra-axial collection or mass lesion/mass effect. There is bilateral periventricular hypodensity, which is non-specific but most likely seen in the settings of microvascular ischemic changes. Mild in extent. Otherwise normal appearance of brain parenchyma. Ventricles are normal. Cerebral volume is age appropriate. Vascular: No hyperdense vessel or unexpected calcification. Intracranial arteriosclerosis. Skull: Normal. Negative for fracture or focal lesion. Sinuses/Orbits: No acute finding. Other: Visualized mastoid air cells are unremarkable. No mastoid effusion. CT CERVICAL SPINE FINDINGS Alignment: Normal. This examination does not assess for ligamentous injury or stability. Skull base and vertebrae: There is comminuted and minimally displaced fracture of the left lateral mass with extension up to the superior and articular facets. The fracture also traverses across the left transverse foramina. No other cervical bone fracture noted. There is fracture of the posteromedial aspect of the right first rib. There is associated at least small right pneumothorax partially seen. No primary bone lesion or focal pathologic process. Soft tissues and  spinal canal: No prevertebral fluid or swelling. No visible canal hematoma. Disc levels: Mild-to-moderate multilevel degenerative changes characterized by reduced intervertebral disc height, facet arthropathy and marginal osteophyte formation. Upper chest: There is small right-sided pneumothorax. There is mildly displaced fracture of right posterior first rib and left posterior second rib. Other: There is a partially calcified hypoattenuating approximately 1.8 x 2.4 cm nodule in the inferior left thyroid lobe, incompletely characterized  on the current exam but better characterized with ultrasound from 03/14/2017. IMPRESSION: 1. No acute intracranial abnormality. 2. There is comminuted and minimally displaced fracture of the left lateral mass with extension up to the superior and inferior articular facets. The fracture line also traverses across the left transverse foramina. Further evaluation with neck CTA is recommended to evaluate for vascular injury. 3. There is fracture of the posteromedial aspect of the right first rib. There is associated at least small right pneumothorax, partially seen. There is also mildly displaced fracture of left posterior second rib. Critical Value/emergent results were called by telephone at the time of interpretation on 04/21/2023 at 3:01 pm to provider [facility] , who verbally acknowledged these results. Electronically Signed   By: Jules Schick M.D.   On: 04/21/2023 15:05   (Echo, Carotid, EGD, Colonoscopy, ERCP)    Subjective: Pt c/o fatigue    Discharge Exam: Vitals:   04/26/23 0832 04/26/23 1213  BP: (!) 142/75 135/76  Pulse: 74 78  Resp: 18 18  Temp: 98 F (36.7 C) 98.3 F (36.8 C)  SpO2: 96% 95%   Vitals:   04/26/23 0010 04/26/23 0417 04/26/23 0832 04/26/23 1213  BP: (!) 135/52 129/70 (!) 142/75 135/76  Pulse: 74 78 74 78  Resp: 18 19 18 18   Temp: 97.9 F (36.6 C) 98.3 F (36.8 C) 98 F (36.7 C) 98.3 F (36.8 C)  TempSrc:      SpO2: 96% 97% 96%  95%  Weight:      Height:        General: Pt is alert, awake, not in acute distress. Frail appearing  Cardiovascular: S1/S2 +, no rubs, no gallops Respiratory: CTA bilaterally, no wheezing, no rhonchi Abdominal: Soft, NT, ND, bowel sounds + Extremities: no edema, no cyanosis    The results of significant diagnostics from this hospitalization (including imaging, microbiology, ancillary and laboratory) are listed below for reference.     Microbiology: Recent Results (from the past 240 hours)  MRSA Next Gen by PCR, Nasal     Status: None   Collection Time: 04/22/23  1:38 AM   Specimen: Nasal Mucosa; Nasal Swab  Result Value Ref Range Status   MRSA by PCR Next Gen NOT DETECTED NOT DETECTED Final    Comment: (NOTE) The GeneXpert MRSA Assay (FDA approved for NASAL specimens only), is one component of a comprehensive MRSA colonization surveillance program. It is not intended to diagnose MRSA infection nor to guide or monitor treatment for MRSA infections. Test performance is not FDA approved in patients less than 54 years old. Performed at [facility], [address] [place], [place] [address]      Labs: BNP (last 3 results) No results for input(s): "BNP" in the last 8760 hours. Basic Metabolic Panel: Recent Labs  Lab 04/22/23 0311 04/23/23 0454 04/23/23 0850 04/24/23 0850 04/25/23 0351 04/26/23 0244  NA 131* 123* 122* 121* 127* 130*  K 4.0 3.9  --  4.1 4.6 4.5  CL 96* 90*  --  90* 94* 95*  CO2 29 28  --  25 27 29   GLUCOSE 135* 97  --  108* 116* 115*  BUN 7* 6*  --  7* 8 9  CREATININE 0.57 0.63  --  0.66 0.55 0.58  CALCIUM 8.5* 8.4*  --  7.7* 8.4* 9.0  MG  --  2.1  --   --   --   --   PHOS  --  2.6  --   --  3.3  --    Liver Function Tests: Recent Labs  Lab 04/23/23 0454 04/25/23 0351  ALBUMIN 3.3* 3.2*   No results for input(s): "LIPASE", "AMYLASE" in the last 168 hours. No results for input(s): "AMMONIA" in the last 168 hours. CBC: Recent Labs   Lab 04/21/23 1505 04/23/23 0454 04/24/23 0850  WBC 16.1* 5.7 3.8*  NEUTROABS 15.1*  --   --   HGB 12.4 11.8* 10.2*  HCT 36.2 33.3* 29.0*  MCV 90.3 85.4 83.8  PLT 389 359 242   Cardiac Enzymes: No results for input(s): "CKTOTAL", "CKMB", "CKMBINDEX", "TROPONINI" in the last 168 hours. BNP: Invalid input(s): "POCBNP" CBG: Recent Labs  Lab 04/22/23 0128  GLUCAP 115*   D-Dimer No results for input(s): "DDIMER" in the last 72 hours. Hgb A1c No results for input(s): "HGBA1C" in the last 72 hours. Lipid Profile No results for input(s): "CHOL", "HDL", "LDLCALC", "TRIG", "CHOLHDL", "LDLDIRECT" in the last 72 hours. Thyroid function studies Recent Labs    04/25/23 0351  TSH 6.712*   Anemia work up No results for input(s): "VITAMINB12", "FOLATE", "FERRITIN", "TIBC", "IRON", "RETICCTPCT" in the last 72 hours. Urinalysis    Component Value Date/Time   COLORURINE STRAW (A) 09/29/2016 0758   APPEARANCEUR CLEAR (A) 09/29/2016 0758   LABSPEC 1.004 (L) 09/29/2016 0758   PHURINE 6.0 09/29/2016 0758   GLUCOSEU 50 (A) 09/29/2016 0758   HGBUR NEGATIVE 09/29/2016 0758   BILIRUBINUR NEGATIVE 09/29/2016 0758   KETONESUR NEGATIVE 09/29/2016 0758   PROTEINUR NEGATIVE 09/29/2016 0758   NITRITE NEGATIVE 09/29/2016 0758   LEUKOCYTESUR NEGATIVE 09/29/2016 0758   Sepsis Labs Recent Labs  Lab 04/21/23 1505 04/23/23 0454 04/24/23 0850  WBC 16.1* 5.7 3.8*   Microbiology Recent Results (from the past 240 hours)  MRSA Next Gen by PCR, Nasal     Status: None   Collection Time: 04/22/23  1:38 AM   Specimen: Nasal Mucosa; Nasal Swab  Result Value Ref Range Status   MRSA by PCR Next Gen NOT DETECTED NOT DETECTED Final    Comment: (NOTE) The GeneXpert MRSA Assay (FDA approved for NASAL specimens only), is one component of a comprehensive MRSA colonization surveillance program. It is not intended to diagnose MRSA infection nor to guide or monitor treatment for MRSA infections. Test  performance is not FDA approved in patients less than 47 years old. Performed at [facility], [address], [place], [place] [address]      Time coordinating discharge: Over 30 minutes  SIGNED:   Alphonsus Jeans, MD  [facility] 04/26/2023, 2:08 PM Pager   If 7PM-7AM, please contact night-coverage [email or web address]

## 2023-04-26 NOTE — TOC Transition Note (Signed)
 Transition of Care [facility]) - Discharge Note   Patient Details  Name: Kathryn Hart MRN: [id number] Date of Birth: [birth year]  Transition of Care [facility]) CM/SW Contact:  Crayton Docker, RN 04/26/2023, 1:41 PM   Clinical Narrative:     Discharge orders noted. Noted, home health orders for home health PT/OT and 3 in 1 [facility]. CM call to patient regarding home health preferences and DME delivery to home address on file. Per patient, no preferences. Per patient, patient's son, Kathryn Hart will provide transportation at discharge.  Final next level of care: Home w Home Health Services Barriers to Discharge: No Barriers Identified   Patient Goals and CMS Choice    Home   Discharge Placement     Home health   DME: 3 in 1 [facility]   Discharge Plan and Services Additional resources added to the After Visit Summary for       Post Acute Care Choice: Home Health, Durable Medical Equipment          DME Arranged: 3-N-1 DME Agency: [facility] Date DME Agency Contacted: 04/26/23 Time DME Agency Contacted: [phone] Representative spoke with at DME Agency: Mitch HH Arranged: PT, OT HH Agency: [facility] Date [facility] Agency Contacted: 04/26/23 Time HH Agency Contacted: 1330 Representative spoke with at [facility] Agency: Bartholomew Light  Social Drivers of Health (SDOH) Interventions SDOH Screenings   Food Insecurity: No Food Insecurity (04/22/2023)  Housing: Low Risk  (04/22/2023)  Transportation Needs: No Transportation Needs (04/22/2023)  Utilities: Not At Risk (04/22/2023)  Financial Resource Strain: Low Risk  (09/05/2022)   Received from [facility]  Social Connections: Unknown (04/22/2023)  Tobacco Use: Medium Risk (04/22/2023)     Readmission Risk Interventions     No data to display

## 2023-04-26 NOTE — Plan of Care (Signed)

## 2023-04-26 NOTE — Progress Notes (Signed)
 [facility], [place] 04/26/23  Subjective:   LOS: 5 04/15 0701 - 04/16 0700 In: 480 [P.O.:480] Out: -   Patient feels well today, stronger Monitoring fluid intake  Sodium 130  Objective:  Vital signs in last 24 hours:  Temp:  [97.9 F (36.6 C)-98.3 F (36.8 C)] 98 F (36.7 C) (04/16 0832) Pulse Rate:  [74-92] 74 (04/16 0832) Resp:  [16-19] 18 (04/16 0832) BP: (129-151)/(52-79) 142/75 (04/16 0832) SpO2:  [94 %-97 %] 96 % (04/16 0832)  Weight change:  Filed Weights   04/23/23 0500 04/24/23 0500 04/25/23 0300  Weight: 49.8 kg 51.1 kg 48.4 kg    Intake/Output:    Intake/Output Summary (Last 24 hours) at 04/26/2023 1139 Last data filed at 04/26/2023 1013 Gross per 24 hour  Intake 480 ml  Output --  Net 480 ml     Physical Exam: General: No acute distress, laying in the bed  HEENT Moist oral mucous membranes  Pulm/lungs Normal breathing effort  CVS/Heart Normal rate  Abdomen:  Soft, nontender, nondistended  Extremities: No peripheral edema  Neurologic: Alert, oriented, able to answer questions appropriately  Skin: No acute rashes   Neck brace in place       Basic Metabolic Panel:  Recent Labs  Lab 04/22/23 0311 04/23/23 0454 04/23/23 0850 04/24/23 0850 04/25/23 0351 04/26/23 0244  NA 131* 123* 122* 121* 127* 130*  K 4.0 3.9  --  4.1 4.6 4.5  CL 96* 90*  --  90* 94* 95*  CO2 29 28  --  25 27 29   GLUCOSE 135* 97  --  108* 116* 115*  BUN 7* 6*  --  7* 8 9  CREATININE 0.57 0.63  --  0.66 0.55 0.58  CALCIUM 8.5* 8.4*  --  7.7* 8.4* 9.0  MG  --  2.1  --   --   --   --   PHOS  --  2.6  --   --  3.3  --      CBC: Recent Labs  Lab 04/21/23 1505 04/23/23 0454 04/24/23 0850  WBC 16.1* 5.7 3.8*  NEUTROABS 15.1*  --   --   HGB 12.4 11.8* 10.2*  HCT 36.2 33.3* 29.0*  MCV 90.3 85.4 83.8  PLT 389 359 242     No results found for: "HEPBSAG", "HEPBSAB", "HEPBIGM"    Microbiology:  Recent Results (from the past 240  hours)  MRSA Next Gen by PCR, Nasal     Status: None   Collection Time: 04/22/23  1:38 AM   Specimen: Nasal Mucosa; Nasal Swab  Result Value Ref Range Status   MRSA by PCR Next Gen NOT DETECTED NOT DETECTED Final    Comment: (NOTE) The GeneXpert MRSA Assay (FDA approved for NASAL specimens only), is one component of a comprehensive MRSA colonization surveillance program. It is not intended to diagnose MRSA infection nor to guide or monitor treatment for MRSA infections. Test performance is not FDA approved in patients less than 21 years old. Performed at [facility], [address] [place], [place] [address]     Coagulation Studies: No results for input(s): "LABPROT", "INR" in the last 72 hours.  Urinalysis: No results for input(s): "COLORURINE", "LABSPEC", "PHURINE", "GLUCOSEU", "HGBUR", "BILIRUBINUR", "KETONESUR", "PROTEINUR", "UROBILINOGEN", "NITRITE", "LEUKOCYTESUR" in the last 72 hours.  Invalid input(s): "APPERANCEUR"    Imaging: DG Chest Port 1 View Result Date: 04/25/2023 CLINICAL DATA:  Shortness of breath.  Follow-up pneumothorax EXAM: PORTABLE CHEST 1 VIEW COMPARISON:  04/24/2023 FINDINGS: No residual or recurrent pneumothorax seen. Patchy opacity in the right mid lung again noted, unchanged. No confluent opacity on the left. Heart mediastinal contours are within normal limits. No visible effusion. No acute bony abnormality. IMPRESSION: No residual or recurrent pneumothorax. Electronically Signed   By: Janeece Mechanic M.D.   On: 04/25/2023 17:21   DG Chest Port 1 View Result Date: 04/24/2023 CLINICAL DATA:  Follow-up right-sided pneumothorax EXAM: PORTABLE CHEST 1 VIEW COMPARISON:  Film from earlier in the same day. FINDINGS: Pigtail catheter on the right has been removed in the interval. No residual pneumothorax is seen. No focal infiltrate or effusion is noted. Cardiac shadow is stable. No bony abnormality is noted. IMPRESSION: No recurrent pneumothorax following  chest tube removal on the right. Electronically Signed   By: Violeta Grey M.D.   On: 04/24/2023 22:42   DG Chest Port 1 View Result Date: 04/24/2023 CLINICAL DATA:  Follow-up pneumothorax EXAM: PORTABLE CHEST 1 VIEW COMPARISON:  04/24/2023 FINDINGS: RIGHT chest tube in place. No appreciable pneumothorax. Chronic bronchitic markings noted. Low lung volumes and mild basilar atelectasis. IMPRESSION: No pneumothorax.  Chest tube in place. Electronically Signed   By: Deboraha Fallow M.D.   On: 04/24/2023 18:41     Medications:    chlorproMAZINE (THORAZINE) 12.5 mg in sodium chloride 0.9 % 25 mL IVPB 12.5 mg (04/23/23 2045)    buPROPion  150 mg Oral BID   calcium carbonate  1 tablet Oral BID WC   Chlorhexidine Gluconate Cloth  6 each Topical Daily   enoxaparin (LOVENOX) injection  40 mg Subcutaneous Q24H   famotidine  20 mg Oral BID   feeding supplement  237 mL Oral BID BM   gabapentin  100 mg Oral BID   melatonin  2.5 mg Oral QHS   polyethylene glycol  17 g Oral Daily   senna-docusate  2 tablet Oral QHS   traZODone  100 mg Oral QHS   acetaminophen, alum & mag hydroxide-simeth, chlorproMAZINE (THORAZINE) 12.5 mg in sodium chloride 0.9 % 25 mL IVPB, docusate sodium, naLOXone (NARCAN)  injection, ondansetron (ZOFRAN) IV, mouth rinse, oxyCODONE, polyethylene glycol, tiZANidine  Assessment/ Plan:  78 y.o. 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  Patient was first noted to have mild hyponatremia in September 2024 in January 2025.  Sodium level of 133-134 was noted.  Prior to admission on 04/18/2023, her sodium was 132. Sodium level progressively got worse during hospitalization. No diuretic.  Thyroid function last year was normal. Chest CT is Abnormal and does demonstrate consolidation as noted above. Patient reports her appetite is poor and she has been drinking a good amount of water.   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   Plan: Sodium has improved today, 130 Patient encouraged to continue to monitor fluid intake, 1200cc/day Continue Ensure twice daily  Patient cleared to discharge from renal stance and labs can be  followed by PCP.     LOS: 5 Kathryn Hart 4/16/202511:39 AM  [facility] [place], [place] [phone]

## 2023-04-26 NOTE — Progress Notes (Signed)
 OT Cancellation Note  Patient Details Name: Kathryn Hart MRN: [id number] DOB: 27-Mar-1945   Cancelled Treatment:    Reason Eval/Treat Not Completed: Other (comment). Pt received in chair, dressed and eager for discharge. OT answers questions regarding donning/doffing hard cervical collar supine when pads need to be changed out. Encouraged pt to reach out to neurosurgery team regarding further questions or concerns. Pt verbalizes understanding of wear schedule on at all times.   Jep Dyas L. Amaiyah Nordhoff, OTR/L  04/26/23, 3:20 PM

## 2023-05-03 ENCOUNTER — Other Ambulatory Visit: Payer: Self-pay | Admitting: Family Medicine

## 2023-05-03 DIAGNOSIS — Z8781 Personal history of (healed) traumatic fracture: Secondary | ICD-10-CM

## 2023-05-03 NOTE — Addendum Note (Signed)
 Addended by: Anise Kerns on: 05/03/2023 11:25 AM   Modules accepted: Orders

## 2023-05-08 ENCOUNTER — Encounter: Payer: Self-pay | Admitting: Physician Assistant

## 2023-05-08 ENCOUNTER — Ambulatory Visit
Admission: RE | Admit: 2023-05-08 | Discharge: 2023-05-08 | Disposition: A | Discharge status: Home / Self Care | Source: Ambulatory Visit | Attending: Physician Assistant | Admitting: Physician Assistant

## 2023-05-08 ENCOUNTER — Ambulatory Visit
Admission: RE | Admit: 2023-05-08 | Discharge: 2023-05-08 | Disposition: A | Discharge status: Home / Self Care | Attending: Physician Assistant | Admitting: Physician Assistant

## 2023-05-08 ENCOUNTER — Ambulatory Visit (INDEPENDENT_AMBULATORY_CARE_PROVIDER_SITE_OTHER): Admitting: Physician Assistant

## 2023-05-08 VITALS — BP 160/90 | Ht 62.0 in | Wt 106.0 lb

## 2023-05-08 DIAGNOSIS — Z8781 Personal history of (healed) traumatic fracture: Secondary | ICD-10-CM | POA: Insufficient documentation

## 2023-05-08 DIAGNOSIS — S12040D Displaced lateral mass fracture of first cervical vertebra, subsequent encounter for fracture with routine healing: Secondary | ICD-10-CM | POA: Diagnosis not present

## 2023-05-08 DIAGNOSIS — W108XXD Fall (on) (from) other stairs and steps, subsequent encounter: Secondary | ICD-10-CM

## 2023-05-08 DIAGNOSIS — S22059A Unspecified fracture of T5-T6 vertebra, initial encounter for closed fracture: Secondary | ICD-10-CM | POA: Diagnosis not present

## 2023-05-08 NOTE — Progress Notes (Signed)
 History: Kathryn Hart comes today 2 weeks after suffering a fall down the stairs in which she suffered a C1 lateral mass fracture, T5 compression fracture, 5 right-sided rib fractures, and pneumothorax.  Overall she is doing okay.  The majority of her pain is in her rib area.  She denies any neck pain at this time she also denies back pain.  She denies any new weakness, numbness or tingling.  She feels as though her cervical collar does not fit her very well and she is having difficulty optimizing it.   Physical Exam: Vitals:   05/08/23 1413  BP: (!) 160/90    AA Ox3 Cranial nerves grossly intact.  Strength: No major gross deficits noted  She has good strength in bilateral upper and lower extremities.  Her cervical collar is intact and was adjusted during the visit.    Imaging: CT head, 04/21/2023: IMPRESSION: 1. No acute intracranial abnormality. 2. There is comminuted and minimally displaced fracture of the left lateral mass with extension up to the superior and inferior articular facets. The fracture line also traverses across the left transverse foramina. Further evaluation with neck CTA is recommended to evaluate for vascular injury. 3. There is fracture of the posteromedial aspect of the right first rib. There is associated at least small right pneumothorax, partially seen. There is also mildly displaced fracture of left posterior second rib.  CT abdomen, 04/21/2023:  IMPRESSION: 1. Moderate right pneumothorax with partial collapse of the right lung and mild relative hyperinflation of the right hemithorax which may reflect subtle changes of tension physiology. 2. Acute fractures of the right 5-9 ribs posteriorly with displacement of the right sixth rib fracture. 3. Superimposed airway impaction and tree-in-bud nodularity within the right upper and right lower lobes which may relate to acute or chronic infection. 4. 2.7 cm left thyroid  nodule. This was  previously described on thyroid  sonogram 03/14/2017 and was better assessed on that examination. 5. Diffuse osteopenia. 6. Remote compression deformities of T5, L1, and L2. 7. Remote, nondisplaced insufficiency fracture of the right sacral ala.  IMPRESSION: MRI LUMBAR SPINE:   1. Subtle acute compression fracture involving the inferior endplate of L4 without significant height loss or retropulsion. 2. 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 3. Underlying multilevel lumbar spondylosis and facet hypertrophy as above. No high-grade spinal stenosis. Mild to moderate bilateral L2 foraminal narrowing, with mild bilateral L4 foraminal stenosis.    Assessment/Plan:  Kathryn Hart comes today 2 weeks after suffering a fall down the stairs in which she suffered a C1 lateral mass fracture, T5 compression fracture, 5 right-sided rib fractures, and pneumothorax.  Overall she is doing okay.  The majority of her pain is in her rib area.  She denies any neck pain at this time she also denies back pain.  She denies any new weakness, numbness or tingling.  She feels as though her cervical collar does not fit her very well and she is having difficulty optimizing it.  Her examination is to baseline, she has no acute gross abnormalities.  Patient does not want to go forward with an occipital fusion and manage her fracture conservatively.  Plan on cervical collar for approximately 3 months since injury.  Once she has been in a cervical collar for approximately 3 months, we will plan to get flexion-extension x-rays of her cervical spine to evaluate for any instability.  She is continuing  to take Tylenol  and oxycodone  intermittently for her pain.  She also has a T5 fracture which could be causing some of her rib pain although hard to know for sure considering 5 rib fractures on her right side.  Plan to see back in 1 month for a check-in.   Red flag symptoms reviewed at length with patient and her granddaughter.  Encouraged him to reach out to me for any questions or concerns that they have in the future.   60 minutes was spent face-to-face and non-face-to-face for this encounter including review of previous results, history, obtaining new history and examination, reviewing imaging with the patient, referral placed, and care coordination.   Ludwig Safer Department of Neurosurgery

## 2023-05-08 NOTE — Progress Notes (Signed)
 Order has been faxed to [facility].

## 2023-06-08 ENCOUNTER — Other Ambulatory Visit: Payer: Self-pay

## 2023-06-08 DIAGNOSIS — S12040D Displaced lateral mass fracture of first cervical vertebra, subsequent encounter for fracture with routine healing: Secondary | ICD-10-CM

## 2023-06-09 ENCOUNTER — Ambulatory Visit: Admitting: Physician Assistant

## 2023-06-09 ENCOUNTER — Encounter: Payer: Self-pay | Admitting: Physician Assistant

## 2023-06-09 VITALS — BP 136/76 | Ht 62.0 in | Wt 106.0 lb

## 2023-06-09 DIAGNOSIS — S12040D Displaced lateral mass fracture of first cervical vertebra, subsequent encounter for fracture with routine healing: Secondary | ICD-10-CM

## 2023-06-09 NOTE — Progress Notes (Signed)
 History: Kathryn Hart comes today 6 weeks after suffering a fall down the stairs on 04/21/2023 in which she suffered a C1 lateral mass fracture, T5 compression fracture, 5 right-sided rib fractures, and pneumothorax.  Overall she is doing well.  She is not having any neck pain in her thoracic pain as well as pain in her ribs has improved significantly.  She is only taking Tylenol  as needed.  No new weakness, numbness or tingling.    Physical Exam: Vitals:   06/09/23 1001  BP: 136/76    AA Ox3 Cranial nerves grossly intact.  Strength: No major gross deficits noted  She has good strength in bilateral upper and lower extremities.  Her cervical collar is intact. Imaging: CT head, 04/21/2023: IMPRESSION: 1. No acute intracranial abnormality. 2. There is comminuted and minimally displaced fracture of the left lateral mass with extension up to the superior and inferior articular facets. The fracture line also traverses across the left transverse foramina. Further evaluation with neck CTA is recommended to evaluate for vascular injury. 3. There is fracture of the posteromedial aspect of the right first rib. There is associated at least small right pneumothorax, partially seen. There is also mildly displaced fracture of left posterior second rib.  CT abdomen, 04/21/2023:  IMPRESSION: 1. Moderate right pneumothorax with partial collapse of the right lung and mild relative hyperinflation of the right hemithorax which may reflect subtle changes of tension physiology. 2. Acute fractures of the right 5-9 ribs posteriorly with displacement of the right sixth rib fracture. 3. Superimposed airway impaction and tree-in-bud nodularity within the right upper and right lower lobes which may relate to acute or chronic infection. 4. 2.7 cm left thyroid  nodule. This was previously described on thyroid  sonogram 03/14/2017 and was better assessed on that examination. 5. Diffuse  osteopenia. 6. Remote compression deformities of T5, L1, and L2. 7. Remote, nondisplaced insufficiency fracture of the right sacral ala.  IMPRESSION: MRI LUMBAR SPINE:   1. Subtle acute compression fracture involving the inferior endplate of L4 without significant height loss or retropulsion. 2. 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 3. Underlying multilevel lumbar spondylosis and facet hypertrophy as above. No high-grade spinal stenosis. Mild to moderate bilateral L2 foraminal narrowing, with mild bilateral L4 foraminal stenosis.    Assessment/Plan:  Kathryn Hart comes today 6 weeks after suffering a fall down the stairs on 04/21/2023 in which she suffered a C1 lateral mass fracture, T5 compression fracture, 5 right-sided rib fractures, and pneumothorax.  Overall she is doing well.  She is not having any neck pain in her thoracic pain as well as pain in her ribs has improved significantly.  She is only taking Tylenol  as needed.  No new weakness, numbness or tingling. Her examination is to baseline, she has no acute gross abnormalities.  Patient does not want to go forward with an occipital fusion and manage her fracture conservatively.  Plan on cervical collar for approximately 3 months since injury.  At her next visit when she is 3 months out we will plan for flexion-extension x-rays of her cervical spine to evaluate for fracture stability and make a decision at that point whether or not she has to continue to wear her cervical collar.  Red flag symptoms were reviewed.  She was encouraged reach out to me for any questions or concerns that she has in the future.  30 minutes was spent  face-to-face and non-face-to-face for this encounter including review of previous results, history, obtaining new history and examination, reviewing imaging with the patient, referral placed, and care coordination.   Ludwig Safer Department of Neurosurgery

## 2023-07-18 NOTE — Addendum Note (Signed)
 Addended by: GIRARD DON GAILS on: 07/18/2023 05:05 PM   Modules accepted: Orders

## 2023-07-20 ENCOUNTER — Other Ambulatory Visit: Payer: Self-pay | Admitting: Family Medicine

## 2023-07-20 DIAGNOSIS — Z1231 Encounter for screening mammogram for malignant neoplasm of breast: Secondary | ICD-10-CM

## 2023-07-21 ENCOUNTER — Ambulatory Visit
Admission: RE | Admit: 2023-07-21 | Discharge: 2023-07-21 | Disposition: A | Discharge status: Home / Self Care | Source: Ambulatory Visit | Attending: Physician Assistant | Admitting: Physician Assistant

## 2023-07-21 ENCOUNTER — Ambulatory Visit
Admission: RE | Admit: 2023-07-21 | Discharge: 2023-07-21 | Disposition: A | Discharge status: Home / Self Care | Attending: Physician Assistant | Admitting: Physician Assistant

## 2023-07-21 DIAGNOSIS — S12040D Displaced lateral mass fracture of first cervical vertebra, subsequent encounter for fracture with routine healing: Secondary | ICD-10-CM | POA: Diagnosis present

## 2023-07-24 ENCOUNTER — Ambulatory Visit: Admitting: Physician Assistant

## 2023-07-24 ENCOUNTER — Encounter: Payer: Self-pay | Admitting: Physician Assistant

## 2023-07-24 VITALS — BP 132/74 | Ht 62.0 in | Wt 107.1 lb

## 2023-07-24 DIAGNOSIS — S22059A Unspecified fracture of T5-T6 vertebra, initial encounter for closed fracture: Secondary | ICD-10-CM | POA: Diagnosis not present

## 2023-07-24 DIAGNOSIS — W19XXXD Unspecified fall, subsequent encounter: Secondary | ICD-10-CM | POA: Diagnosis not present

## 2023-07-24 DIAGNOSIS — S12040D Displaced lateral mass fracture of first cervical vertebra, subsequent encounter for fracture with routine healing: Secondary | ICD-10-CM

## 2023-07-24 NOTE — Progress Notes (Unsigned)
 History: Kathryn Hart comes today 12 weeks after suffering a fall down the stairs on 04/21/2023 in which she suffered a C1 lateral mass fracture, T5 compression fracture, 5 right-sided rib fractures, and pneumothorax.     Overall she is doing well.  She is not having any neck pain in her thoracic pain as well as pain in her ribs has improved significantly.  She is only taking Tylenol  as needed.  No new weakness, numbness or tingling.    Physical Exam: Vitals:   07/24/23 1105  BP: 132/74    AA Ox3 Cranial nerves grossly intact.  Strength: No major gross deficits noted  She has good strength in bilateral upper and lower extremities.  Her cervical collar is intact. Imaging: CT head, 04/21/2023: IMPRESSION: 1. No acute intracranial abnormality. 2. There is comminuted and minimally displaced fracture of the left lateral mass with extension up to the superior and inferior articular facets. The fracture line also traverses across the left transverse foramina. Further evaluation with neck CTA is recommended to evaluate for vascular injury. 3. There is fracture of the posteromedial aspect of the right first rib. There is associated at least small right pneumothorax, partially seen. There is also mildly displaced fracture of left posterior second rib.  CT abdomen, 04/21/2023:  IMPRESSION: 1. Moderate right pneumothorax with partial collapse of the right lung and mild relative hyperinflation of the right hemithorax which may reflect subtle changes of tension physiology. 2. Acute fractures of the right 5-9 ribs posteriorly with displacement of the right sixth rib fracture. 3. Superimposed airway impaction and tree-in-bud nodularity within the right upper and right lower lobes which may relate to acute or chronic infection. 4. 2.7 cm left thyroid  nodule. This was previously described on thyroid  sonogram 03/14/2017 and was better assessed on that examination. 5. Diffuse  osteopenia. 6. Remote compression deformities of T5, L1, and L2. 7. Remote, nondisplaced insufficiency fracture of the right sacral ala.  IMPRESSION: MRI LUMBAR SPINE:   1. Subtle acute compression fracture involving the inferior endplate of L4 without significant height loss or retropulsion. 2. 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 3. Underlying multilevel lumbar spondylosis and facet hypertrophy as above. No high-grade spinal stenosis. Mild to moderate bilateral L2 foraminal narrowing, with mild bilateral L4 foraminal stenosis.    Assessment/Plan:  Kathryn Hart comes today 6 weeks after suffering a fall down the stairs on 04/21/2023 in which she suffered a C1 lateral mass fracture, T5 compression fracture, 5 right-sided rib fractures, and pneumothorax.  Overall she is doing well.  She is not having any neck pain in her thoracic pain as well as pain in her ribs has improved significantly.  She is only taking Tylenol  as needed.  No new weakness, numbness or tingling. Her examination is to baseline, she has no acute gross abnormalities.  Patient does not want to go forward with an occipital fusion and manage her fracture conservatively.  Plan on cervical collar for approximately 3 months since injury.  At her next visit when she is 3 months out we will plan for flexion-extension x-rays of her cervical spine to evaluate for fracture stability and make a decision at that point whether or not she has to continue to wear her cervical collar.  Red flag symptoms were reviewed.  She was encouraged reach out to me for any questions or concerns that she has in the future.  30  minutes was spent face-to-face and non-face-to-face for this encounter including review of previous results, history, obtaining new history and examination, reviewing imaging with the patient, referral placed, and care coordination.   Lyle Decamp Department of Neurosurgery

## 2023-08-30 ENCOUNTER — Ambulatory Visit
Admission: RE | Admit: 2023-08-30 | Discharge: 2023-08-30 | Disposition: A | Discharge status: Home / Self Care | Source: Ambulatory Visit | Attending: Family Medicine | Admitting: Family Medicine

## 2023-08-30 DIAGNOSIS — Z1231 Encounter for screening mammogram for malignant neoplasm of breast: Secondary | ICD-10-CM | POA: Insufficient documentation
